# Patient Record
Sex: Female | Born: 1970 | Race: White | Hispanic: No | Smoking: Never smoker
Health system: Southern US, Community
[De-identification: ages and names within clinical notes are randomized; demographics above are authoritative.]

## PROBLEM LIST (undated history)

## (undated) DIAGNOSIS — R636 Underweight: Secondary | ICD-10-CM

---

## 2013-07-17 ENCOUNTER — Emergency Department (HOSPITAL_COMMUNITY): Payer: Medicaid Other

## 2013-07-17 ENCOUNTER — Encounter (HOSPITAL_COMMUNITY): Payer: Self-pay | Admitting: Emergency Medicine

## 2013-07-17 ENCOUNTER — Inpatient Hospital Stay (HOSPITAL_COMMUNITY)
Admission: EM | Admit: 2013-07-17 | Discharge: 2013-07-20 | DRG: 480 | Disposition: A | Discharge status: Home / Self Care | Payer: Medicaid Other | Attending: Internal Medicine | Admitting: Internal Medicine

## 2013-07-17 DIAGNOSIS — S72141A Displaced intertrochanteric fracture of right femur, initial encounter for closed fracture: Secondary | ICD-10-CM | POA: Diagnosis present

## 2013-07-17 DIAGNOSIS — Z681 Body mass index (BMI) 19 or less, adult: Secondary | ICD-10-CM | POA: Diagnosis present

## 2013-07-17 DIAGNOSIS — M84453A Pathological fracture, unspecified femur, initial encounter for fracture: Principal | ICD-10-CM | POA: Diagnosis present

## 2013-07-17 DIAGNOSIS — R9431 Abnormal electrocardiogram [ECG] [EKG]: Secondary | ICD-10-CM | POA: Diagnosis present

## 2013-07-17 DIAGNOSIS — E872 Acidosis, unspecified: Secondary | ICD-10-CM | POA: Diagnosis present

## 2013-07-17 DIAGNOSIS — F5000 Anorexia nervosa, unspecified: Secondary | ICD-10-CM | POA: Diagnosis present

## 2013-07-17 DIAGNOSIS — R636 Underweight: Secondary | ICD-10-CM | POA: Diagnosis present

## 2013-07-17 DIAGNOSIS — M810 Age-related osteoporosis without current pathological fracture: Secondary | ICD-10-CM | POA: Diagnosis present

## 2013-07-17 DIAGNOSIS — E8729 Other acidosis: Secondary | ICD-10-CM

## 2013-07-17 DIAGNOSIS — D62 Acute posthemorrhagic anemia: Secondary | ICD-10-CM | POA: Diagnosis present

## 2013-07-17 DIAGNOSIS — W010XXA Fall on same level from slipping, tripping and stumbling without subsequent striking against object, initial encounter: Secondary | ICD-10-CM

## 2013-07-17 DIAGNOSIS — E41 Nutritional marasmus: Secondary | ICD-10-CM | POA: Diagnosis present

## 2013-07-17 DIAGNOSIS — S72143A Displaced intertrochanteric fracture of unspecified femur, initial encounter for closed fracture: Secondary | ICD-10-CM

## 2013-07-17 HISTORY — DX: Underweight: R63.6

## 2013-07-17 LAB — FOLATE: Folate: 15.7 ng/mL

## 2013-07-17 LAB — KETONES, QUALITATIVE: Acetone, Bld: NEGATIVE

## 2013-07-17 LAB — IRON AND TIBC
Iron: 30 ug/dL — ABNORMAL LOW (ref 42–135)
Saturation Ratios: 11 % — ABNORMAL LOW (ref 20–55)
TIBC: 281 ug/dL (ref 250–470)
UIBC: 251 ug/dL (ref 125–400)

## 2013-07-17 LAB — BASIC METABOLIC PANEL
BUN: 22 mg/dL (ref 6–23)
CHLORIDE: 97 meq/L (ref 96–112)
CO2: 27 mEq/L (ref 19–32)
Calcium: 9.7 mg/dL (ref 8.4–10.5)
Creatinine, Ser: 0.68 mg/dL (ref 0.50–1.10)
GFR calc Af Amer: >90 mL/min (ref >90)
GFR calc non Af Amer: >90 mL/min (ref >90)
GLUCOSE: 116 mg/dL — AB (ref 70–99)
Potassium: 4.2 mEq/L (ref 3.7–5.3)
SODIUM: 141 meq/L (ref 137–147)

## 2013-07-17 LAB — RETICULOCYTES
RBC.: 3.24 MIL/uL — AB (ref 3.87–5.11)
Retic Count, Absolute: 74.5 10*3/uL (ref 19.0–186.0)
Retic Ct Pct: 2.3 % (ref 0.4–3.1)

## 2013-07-17 LAB — TYPE AND SCREEN
ABO/RH(D): B POS
Antibody Screen: NEGATIVE

## 2013-07-17 LAB — PROTIME-INR
INR: 0.93 (ref 0.00–1.49)
PROTHROMBIN TIME: 12.3 s (ref 11.6–15.2)

## 2013-07-17 LAB — HEPATIC FUNCTION PANEL
ALK PHOS: 85 U/L (ref 39–117)
ALT: 23 U/L (ref 0–35)
AST: 22 U/L (ref 0–37)
Albumin: 2.9 g/dL — ABNORMAL LOW (ref 3.5–5.2)
BILIRUBIN TOTAL: 0.4 mg/dL (ref 0.3–1.2)
Bilirubin, Direct: <0.2 mg/dL (ref 0.0–0.3)
Total Protein: 5.9 g/dL — ABNORMAL LOW (ref 6.0–8.3)

## 2013-07-17 LAB — CBC
HCT: 32.9 % — ABNORMAL LOW (ref 36.0–46.0)
Hemoglobin: 11 g/dL — ABNORMAL LOW (ref 12.0–15.0)
MCH: 29.6 pg (ref 26.0–34.0)
MCHC: 33.4 g/dL (ref 30.0–36.0)
MCV: 88.7 fL (ref 78.0–100.0)
PLATELETS: 298 10*3/uL (ref 150–400)
RBC: 3.71 MIL/uL — ABNORMAL LOW (ref 3.87–5.11)
RDW: 12.7 % (ref 11.5–15.5)
WBC: 7.5 10*3/uL (ref 4.0–10.5)

## 2013-07-17 LAB — FERRITIN: FERRITIN: 76 ng/mL (ref 10–291)

## 2013-07-17 LAB — LACTIC ACID, PLASMA: Lactic Acid, Venous: 0.7 mmol/L (ref 0.5–2.2)

## 2013-07-17 LAB — VITAMIN B12: Vitamin B-12: 451 pg/mL (ref 211–911)

## 2013-07-17 LAB — MAGNESIUM: Magnesium: 2 mg/dL (ref 1.5–2.5)

## 2013-07-17 LAB — ABO/RH: ABO/RH(D): B POS

## 2013-07-17 LAB — PHOSPHORUS: Phosphorus: 5.1 mg/dL — ABNORMAL HIGH (ref 2.3–4.6)

## 2013-07-17 MED ORDER — ACETAMINOPHEN 325 MG PO TABS
650.0000 mg | ORAL_TABLET | Freq: Four times a day (QID) | ORAL | Status: DC | PRN
  Administered 2013-07-18 – 2013-07-20 (×6): 650 mg via ORAL
  Filled 2013-07-17 (×7): qty 2

## 2013-07-17 MED ORDER — CEFAZOLIN SODIUM-DEXTROSE 2-3 GM-% IV SOLR
2.0000 g | INTRAVENOUS | Status: AC
  Administered 2013-07-18: 2 g via INTRAVENOUS
  Filled 2013-07-17: qty 50

## 2013-07-17 MED ORDER — THIAMINE HCL 100 MG/ML IJ SOLN
100.0000 mg | Freq: Once | INTRAMUSCULAR | Status: AC
  Administered 2013-07-17: 100 mg via INTRAVENOUS
  Filled 2013-07-17: qty 1

## 2013-07-17 MED ORDER — MORPHINE SULFATE 2 MG/ML IJ SOLN
2.0000 mg | Freq: Once | INTRAMUSCULAR | Status: AC
  Administered 2013-07-17: 2 mg via INTRAVENOUS
  Filled 2013-07-17: qty 1

## 2013-07-17 MED ORDER — MORPHINE SULFATE 2 MG/ML IJ SOLN: 2.0000 mg | INTRAMUSCULAR | Status: DC | PRN

## 2013-07-17 MED ORDER — ACETAMINOPHEN 650 MG RE SUPP: 650.0000 mg | Freq: Four times a day (QID) | RECTAL | Status: DC | PRN

## 2013-07-17 MED ORDER — DEXTROSE-NACL 5-0.9 % IV SOLN
INTRAVENOUS | Status: DC
  Administered 2013-07-17: 50 mL/h via INTRAVENOUS

## 2013-07-17 MED ORDER — DIAZEPAM 5 MG PO TABS
5.0000 mg | ORAL_TABLET | Freq: Once | ORAL | Status: AC
Start: 2013-07-17 — End: 2013-07-17
  Administered 2013-07-17: 5 mg via ORAL
  Filled 2013-07-17: qty 1

## 2013-07-17 MED ORDER — PROCHLORPERAZINE EDISYLATE 5 MG/ML IJ SOLN
5.0000 mg | Freq: Four times a day (QID) | INTRAMUSCULAR | Status: DC | PRN
  Filled 2013-07-17: qty 1

## 2013-07-17 NOTE — ED Provider Notes (Signed)
CSN: [id number]     Arrival date & time 07/17/13  0902 History   First MD Initiated Contact with Patient 07/17/13 [phone]     Chief Complaint  Patient presents with  . Groin Pain     (Consider location/radiation/quality/duration/timing/severity/associated sxs/prior Treatment) HPI Comments: Patient is a 43 year old female the past medical history of anorexia who presents to the emergency department complaining of right groin pain and hip pain x4 days after stepping over a gate for her cats causing herself to slip and "butterfly" her legs out. Since then she has had non-radiating pain to her groin, progressively worsening, worse with ambulation rated 8.5/10. States she is unable to bear weight on her right leg and has been crawling up and down stairs. Denies back or knee pain. Denies numbness or tingling.  Patient is a 43 y.o. female presenting with groin pain. The history is provided by the patient.  Groin Pain    History reviewed. No pertinent past medical history. History reviewed. No pertinent past surgical history. History reviewed. No pertinent family history. History  Substance Use Topics  . Smoking status: Never Smoker   . Smokeless tobacco: Never Used  . Alcohol Use: No   OB History   Grav Para Term Preterm Abortions TAB SAB Ect Mult Living                 Review of Systems  Musculoskeletal:       Positive for right groin and hip pain.  All other systems reviewed and are negative.      Allergies  Penicillins  Home Medications   Current Outpatient Rx  Name  Route  Sig  Dispense  Refill  . ibuprofen (ADVIL,MOTRIN) 200 MG tablet   Oral   Take 200 mg by mouth every 6 (six) hours as needed for fever, headache or mild pain.         Marland Kitchen. OVER THE COUNTER MEDICATION   Topical   Apply 1 application topically 3 (three) times daily as needed (for pain). Natural pain relief          BP 103/60  Pulse 100  Temp(Src) 97.9 F (36.6 C) (Oral)  Resp 16  Ht 5\' 9"  (1.753  m)  Wt 100 lb (45.36 kg)  BMI 14.76 kg/m2  SpO2 96% Physical Exam  Nursing note and vitals reviewed. Constitutional: She is oriented to person, place, and time. She appears well-developed. No distress.  Thin.  HENT:  Head: Normocephalic and atraumatic.  Mouth/Throat: Oropharynx is clear and moist.  Eyes: Conjunctivae are normal.  Neck: Normal range of motion. Neck supple.  Cardiovascular: Regular rhythm, normal heart sounds and intact distal pulses.  Tachycardia present.   Pulses:      Femoral pulses are 2+ on the right side.      Dorsalis pedis pulses are 2+ on the right side.       Posterior tibial pulses are 2+ on the right side.  Pulmonary/Chest: Effort normal and breath sounds normal.  Musculoskeletal: She exhibits no edema.       Legs: TTP right groin area. No bony tenderness. Hip flexion limited due to pain. Pain with internal and external rotation. No pelvic tenderness. Normal lumbar spine and right knee. No deformity or swelling.  Neurological: She is alert and oriented to person, place, and time. No sensory deficit.  Skin: Skin is warm and dry. She is not diaphoretic.  Psychiatric: Her behavior is normal. Her mood appears anxious.    ED Course  Procedures (  including critical care time) Labs Review Labs Reviewed  CBC  BASIC METABOLIC PANEL  PROTIME-INR  TYPE AND SCREEN   Imaging Review Dg Hip Complete Right  07/17/2013   CLINICAL DATA:  Pain post trauma  EXAM: RIGHT HIP - COMPLETE 2+ VIEW  COMPARISON:  None.  FINDINGS: Frontal pelvis as well as frontal and lateral right hip images were obtained. There is an impacted intertrochanteric femur fracture on the right. No other fracture. No dislocation. There is mild symmetric narrowing of both hip joints.  IMPRESSION: Impacted intertrochanteric femur fracture on the right.   Electronically Signed   By: Bretta Bang M.D.   On: 07/17/2013 10:14     EKG Interpretation None      MDM   Final diagnoses:  Fracture,  intertrochanteric, right femur   Patient presenting with right hip and groin pain after fall. She is well appearing and in no apparent distress, very anxious. Neurovascularly intact. No deformity. X-ray showing impacted intertrochanteric femur fracture on the right. History of anorexia, probably osteopenic. I spoke with Dr. Eulah Pont, orthopedist on call who will evaluate patient later today and perform surgery tomorrow afternoon. Patient admitted by internal medicine teaching service, attending Dr. Meredith Pel. Preop labs, EKG and chest x-ray ordered per Dr. Eulah Pont. Case discussed with attending Dr. Fayrene Fearing who agrees with plan of care.     Trevor Mace, PA-C 07/17/13 1108

## 2013-07-17 NOTE — Consult Note (Signed)
     ORTHOPAEDIC CONSULTATION  REQUESTING PHYSICIAN: No att. providers found  Chief Complaint: R hip fracture  HPI: Jasmine Wagner is a 43 y.o. female who complains of  Mechanical fall, R hip pain, h/o anorexia   No past medical history on file. No past surgical history on file. History   Social History  . Marital Status: Widowed    Spouse Name: N/A    Number of Children: N/A  . Years of Education: N/A   Social History Main Topics  . Smoking status: Never Smoker   . Smokeless tobacco: Never Used  . Alcohol Use: No  . Drug Use: Not on file  . Sexual Activity: Not on file   Other Topics Concern  . Not on file   Social History Narrative  . No narrative on file   No family history on file. Allergies  Allergen Reactions  . Penicillins Rash   Prior to Admission medications   Medication Sig Start Date End Date Taking? Authorizing Provider  ibuprofen (ADVIL,MOTRIN) 200 MG tablet Take 200 mg by mouth every 6 (six) hours as needed for fever, headache or mild pain.    Historical Provider, MD  OVER THE COUNTER MEDICATION Apply 1 application topically 3 (three) times daily as needed (for pain). Natural pain relief    Historical Provider, MD   Dg Hip Complete Right  07/17/2013   CLINICAL DATA:  Pain post trauma  EXAM: RIGHT HIP - COMPLETE 2+ VIEW  COMPARISON:  None.  FINDINGS: Frontal pelvis as well as frontal and lateral right hip images were obtained. There is an impacted intertrochanteric femur fracture on the right. No other fracture. No dislocation. There is mild symmetric narrowing of both hip joints.  IMPRESSION: Impacted intertrochanteric femur fracture on the right.   Electronically Signed   By: Lowella Grip M.D.   On: 07/17/2013 10:14    Positive ROS: All other systems have been reviewed and were otherwise negative with the exception of those mentioned in the HPI and as above.  Labs cbc No results found for this basename: WBC, HGB, HCT, PLT,  in the last 72  hours  Labs inflam No results found for this basename: ESR, CRP,  in the last 72 hours  Labs coag No results found for this basename: INR, PT, PTT,  in the last 72 hours  No results found for this basename: NA, K, CL, CO2, GLUCOSE, BUN, CREATININE, CALCIUM,  in the last 72 hours  Physical Exam: There were no vitals filed for this visit. General: Alert, no acute distress Cardiovascular: No pedal edema Respiratory: No cyanosis, no use of accessory musculature GI: No organomegaly, abdomen is soft and non-tender Skin: No lesions in the area of chief complaint Neurologic: Sensation intact distally Psychiatric: Patient is competent for consent with normal mood and affect Lymphatic: No axillary or cervical lymphadenopathy  MUSCULOSKELETAL:  RLE: pain with log roll, skin benign, distally NVI Other extremities are atraumatic with painless ROM and NVI.  Assessment: R intertroch fracture  Plan: IM nail on 3/10 Weight Bearing Status: bed rest for now, will adjust to WBAT post op PT VTE px: SCD's and hold chemical px for OR tomorrow, will likely start ASA 325 post op   Edmonia Lynch, D, MD Cell [phone]   07/17/2013 11:01 AM

## 2013-07-17 NOTE — H&P (Signed)
Date: 07/17/2013               Patient Name:  Jasmine Wagner MRN: [id number]  DOB: Aug 21, 1970 Age / Sex: 43 y.o., female   PCP: Lupe Carney, MD         Medical Service: Internal Medicine Teaching Service         Attending Physician: Dr. Farley Ly, MD    First Contact: Dr. Glendell Docker Pager: [phone]  Second Contact: Dr. Burtis Junes Pager: [phone]       After Hours (After 5p/  First Contact Pager: [phone]  weekends / holidays): Second Contact Pager: [phone]   Chief Complaint: hip fracture  History of Present Illness:  The patient is a 43 yo woman, presenting with a hip fracture.  Four days prior to admission, the patient was climbing over a fence, when she lost her balance and "did a split", with acute pain in her right groin.  The pain has been persistent since that time, making the patient unable to bear weight on her right leg.  She has been taking ibuprofen for the pain.  She notes being able to keep up with PO intake of food and water, and notes no other symptoms.  She notes no prior history of fractures, no family history of osteoporosis (mother with osteopenia in 30's), and no tobacco or alcohol usage, though the patient's BMI is currently 14.8, which the patient notes is around her baseline.  Meds: No current facility-administered medications for this encounter.   Current Outpatient Prescriptions  Medication Sig Dispense Refill  . ibuprofen (ADVIL,MOTRIN) 200 MG tablet Take 200 mg by mouth every 6 (six) hours as needed for fever, headache or mild pain.      Marland Kitchen OVER THE COUNTER MEDICATION Apply 1 application topically 3 (three) times daily as needed (for pain). Natural pain relief        Allergies: Allergies as of 07/17/2013 - Review Complete 07/17/2013  Allergen Reaction Noted  . Penicillins Rash 07/17/2013   Past Medical History  Diagnosis Date  . Underweight    History reviewed. No pertinent past surgical history. Family History  Problem Relation Age of Onset  .  Osteopenia Mother 73  . Hypertension Father   . Hypertension Mother    History   Social History  . Marital Status: Widowed    Spouse Name: N/A    Number of Children: N/A  . Years of Education: N/A   Occupational History  . Not on file.   Social History Main Topics  . Smoking status: Never Smoker   . Smokeless tobacco: Never Used  . Alcohol Use: No  . Drug Use: Not on file  . Sexual Activity: Not on file   Other Topics Concern  . Not on file   Social History Narrative   The patient's husband passed away Feb 17, 2013.  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  She has a sister in town.    Review of Systems: General: no fevers, chills, changes in weight, changes in appetite Skin: no rash HEENT: no blurry vision, hearing changes, sore throat Pulm: no dyspnea, coughing, wheezing CV: no chest pain, palpitations, shortness of breath Abd: no abdominal pain, nausea/vomiting, diarrhea/constipation GU: no dysuria, hematuria, polyuria Ext: see HPI Neuro: no weakness, numbness, or tingling  Physical Exam: Blood pressure 111/90, pulse 100, temperature 97.9 F (36.6 C), temperature source Oral, resp. rate 14, height 5\' 9"  (1.753 m), weight 100 lb (45.36 kg), SpO2 100.00%. General: alert, cooperative,  and in no apparent distress HEENT: pupils equal round and reactive to light, vision grossly intact, oropharynx clear and non-erythematous  Neck: supple Lungs: clear to ascultation bilaterally, normal work of respiration, no wheezes, rales, ronchi Heart: regular rate and rhythm, no murmurs, gallops, or rubs Abdomen: soft, non-tender, non-distended, normal bowel sounds Extremities: no cyanosis, clubbing, or edema. Pain with movement of right hip Neurologic: alert & oriented X3, cranial nerves II-XII intact, strength grossly intact though examination limited by pain, sensation intact  Lab results: Basic Metabolic Panel:  Recent Labs  16/02/9602/10/15 1033  NA 141  K 4.2  CL 97    CO2 27  GLUCOSE 116*  BUN 22  CREATININE 0.68  CALCIUM 9.7   CBC:  Recent Labs  07/17/13 1033  WBC 7.5  HGB 11.0*  HCT 32.9*  MCV 88.7  PLT 298   Coagulation:  Recent Labs  07/17/13 1033  LABPROT 12.3  INR 0.93    Imaging results:  Dg Chest 2 View  07/17/2013   CLINICAL DATA:  Preoperative chest radiograph.  EXAM: CHEST  2 VIEW  COMPARISON:  None.  FINDINGS: Hyperinflation is present compatible with emphysema. Flattening of the hemidiaphragms. No airspace disease. Bilateral pleural apical scarring. Extreme apices are excluded from view. Aortic arch atherosclerosis.  IMPRESSION: Emphysema without acute cardiopulmonary disease.   Electronically Signed   By: Andreas NewportGeoffrey  Lamke M.D.   On: 07/17/2013 11:25   Dg Hip Complete Right  07/17/2013   CLINICAL DATA:  Pain post trauma  EXAM: RIGHT HIP - COMPLETE 2+ VIEW  COMPARISON:  None.  FINDINGS: Frontal pelvis as well as frontal and lateral right hip images were obtained. There is an impacted intertrochanteric femur fracture on the right. No other fracture. No dislocation. There is mild symmetric narrowing of both hip joints.  IMPRESSION: Impacted intertrochanteric femur fracture on the right.   Electronically Signed   By: Bretta BangWilliam  Woodruff M.D.   On: 07/17/2013 10:14    Other results: EKG: NSR, no ST abnormalities, prolonged QT  Assessment & Plan by Problem:  # Acute right femur fracture - given benign mechanism of injury, this represents a fragility fracture, likely representing osteoporosis.  The patient's only risk factor is her low weight, with concern for possible anorexia (risk of osteoporosis 30% in anorexia). -orthopedics consulted, plan for operative repair tomorrow -morphine, compazine prn for symptomatic management  # Elevated AG - likely represents starvation ketoacidosis.  Rule out lactic acidosis given recent fall/fracture. -check lactic acid, ketones -start D5-NS  # Underweight - The patient's BMI of 14.8 is  concerning for malnutrition vs anorexia nervosa. -check Mg, phos, thiamine, LFT's -give thiamine prior to D5, given concern for nutritional deficiency  # Normocytic Anemia - no evidence for acute bleed.  Likely related to nutritional deficiency. -monitor CBC's -check anemia panel  # Prolonged QT - QTc = 526, which could be related to nutritional deficiencies (hypomagnesemia), though is also seen independently in anorexia nervosa -check Mg -avoid QT prolonging meds  # Prophy - SCD's per ortho  Dispo: Disposition is deferred at this time, awaiting improvement of current medical problems. Anticipated discharge in approximately 2-4 day(s).   The patient does have a current PCP Lupe Carney(Dean Mitchell, MD) and does not [facility] after discharge.  Signed: Linward Headlandyan K Chamberlain Steinborn, MD 07/17/2013, 11:40 AM

## 2013-07-17 NOTE — Progress Notes (Signed)
Utilization review completed.  

## 2013-07-17 NOTE — H&P (Signed)
Internal Medicine Attending Admission Note Date: 07/17/2013  Patient name: Jasmine OharaLisa Pasqua Medical record number: [id number] Date of birth: 09/16/1970 Age: 43 y.o. Gender: female  I saw and evaluated the patient. I reviewed the resident's note and I agree with the resident's findings and plan as documented in the resident's note, with the following additional comments.  Chief Complaint(s): Right hip pain following fall  History - key components related to admission: Patient is a 43 year old woman who denies significant past medical problems who presented with right hip pain and inability to bear weight on her right leg following a fall which occurred 4 days prior to admission.  Patient reports that she fell when stepping across a pet gate in her home.  She denies any history of heart problems or lung problems.  She has no history of smoking.  She states that her current weight is around her baseline   Physical Exam - key components related to admission:  Filed Vitals:   07/17/13 0945 07/17/13 1030 07/17/13 1130 07/17/13 1241  BP: 112/63 103/60 111/90 99/56  Pulse: 89 100    Temp:      TempSrc:      Resp:   14 22  Height:      Weight:      SpO2: 100% 96% 100% 99%    General: Alert, thin, no acute distress Lungs: Clear Heart: Regular; no extra sounds or murmurs Abdomen: Bowel sounds present, soft, nontender; no hepatosplenomegaly Extremities: No edema  Lab results:   Basic Metabolic Panel:  Recent Labs  40/98/1102/02/21 1033  NA 141  K 4.2  CL 97  CO2 27  GLUCOSE 116*  BUN 22  CREATININE 0.68  CALCIUM 9.7    CBC:  Recent Labs  07/17/13 1033  WBC 7.5  HGB 11.0*  HCT 32.9*  MCV 88.7  PLT 298    Coagulation:  Recent Labs  07/17/13 1033  INR 0.93    Imaging results:  Dg Chest 2 View  07/17/2013   CLINICAL DATA:  Preoperative chest radiograph.  EXAM: CHEST  2 VIEW  COMPARISON:  None.  FINDINGS: Hyperinflation is present compatible with emphysema. Flattening of the  hemidiaphragms. No airspace disease. Bilateral pleural apical scarring. Extreme apices are excluded from view. Aortic arch atherosclerosis.  IMPRESSION: Emphysema without acute cardiopulmonary disease.   Electronically Signed   By: Andreas NewportGeoffrey  Lamke M.D.   On: 07/17/2013 11:25   Dg Hip Complete Right  07/17/2013   CLINICAL DATA:  Pain post trauma  EXAM: RIGHT HIP - COMPLETE 2+ VIEW  COMPARISON:  None.  FINDINGS: Frontal pelvis as well as frontal and lateral right hip images were obtained. There is an impacted intertrochanteric femur fracture on the right. No other fracture. No dislocation. There is mild symmetric narrowing of both hip joints.  IMPRESSION: Impacted intertrochanteric femur fracture on the right.   Electronically Signed   By: Bretta BangWilliam  Woodruff M.D.   On: 07/17/2013 10:14    Other results: EKG: Sinus rhythm; PVC; prolonged QTc  Assessment & Plan by Problem:  1.  Right intertrochanteric femur fracture.  Patient was seen by orthopedic surgeon Dr. Eulah PontMurphy, who plans surgery tomorrow.  Plans include pain control; management as per Dr. Eulah PontMurphy.  2.  Low body weight.  Patient is substantially underweight, and this needs further evaluation and a nutrition consult.  She reports that her weight is stable, but acknowledges that she needs to gain weight.  She does not have symptoms that suggest a particular cause.  Plan is  to get records from her PCP; check TSH and discuss HIV testing with her; nutrition consult; consider further diagnostic evaluation after her hip is repaired.  3. Elevated anion gap.  Patients bicarb does not suggest a metabolic acidosis, and the elevated anion gap may be due to metabolic alkalosis secondary to volume contraction.  Plan is to recheck after IV volume replacement.  4.  Other problems and plans as per the resident physician's note.

## 2013-07-17 NOTE — ED Notes (Signed)
Patient complaint of right hip and right inguinal pain since Thursday.  Patient states she was climbing over a gate for the cats and when she put her right leg over the gate, it moved and she "butterflied"  Her legs out and injured the right hip and inguinal area.  Patient states pain has just gotten progressively worse and she has been unable to weight bear on the right leg.

## 2013-07-18 ENCOUNTER — Encounter (HOSPITAL_COMMUNITY): Payer: Medicaid Other | Admitting: Anesthesiology

## 2013-07-18 ENCOUNTER — Encounter (HOSPITAL_COMMUNITY)
Admission: EM | Disposition: A | Discharge status: Home / Self Care | Payer: Self-pay | Source: Home / Self Care | Attending: Internal Medicine

## 2013-07-18 ENCOUNTER — Inpatient Hospital Stay: Admit: 2013-07-18 | Payer: Self-pay | Admitting: Internal Medicine

## 2013-07-18 ENCOUNTER — Inpatient Hospital Stay (HOSPITAL_COMMUNITY): Payer: Medicaid Other

## 2013-07-18 ENCOUNTER — Inpatient Hospital Stay (HOSPITAL_COMMUNITY): Payer: Medicaid Other | Admitting: Anesthesiology

## 2013-07-18 HISTORY — PX: FEMUR IM NAIL: SHX1597

## 2013-07-18 LAB — BASIC METABOLIC PANEL
BUN: 18 mg/dL (ref 6–23)
CHLORIDE: 103 meq/L (ref 96–112)
CO2: 29 mEq/L (ref 19–32)
Calcium: 8.7 mg/dL (ref 8.4–10.5)
Creatinine, Ser: 0.64 mg/dL (ref 0.50–1.10)
Glucose, Bld: 98 mg/dL (ref 70–99)
POTASSIUM: 4.1 meq/L (ref 3.7–5.3)
Sodium: 141 mEq/L (ref 137–147)

## 2013-07-18 LAB — ALBUMIN: Albumin: 2.9 g/dL — ABNORMAL LOW (ref 3.5–5.2)

## 2013-07-18 LAB — VITAMIN D 25 HYDROXY (VIT D DEFICIENCY, FRACTURES): VIT D 25 HYDROXY: 28 ng/mL — AB (ref 30–89)

## 2013-07-18 LAB — MRSA PCR SCREENING: MRSA BY PCR: NEGATIVE

## 2013-07-18 SURGERY — INSERTION, INTRAMEDULLARY ROD, FEMUR
Anesthesia: General | Site: Hip | Laterality: Right

## 2013-07-18 MED ORDER — GLYCOPYRROLATE 0.2 MG/ML IJ SOLN
INTRAMUSCULAR | Status: DC | PRN
  Administered 2013-07-18: .7 mg via INTRAVENOUS

## 2013-07-18 MED ORDER — HYDROMORPHONE HCL PF 1 MG/ML IJ SOLN
INTRAMUSCULAR | Status: AC
Start: 2013-07-18 — End: 2013-07-19
  Filled 2013-07-18: qty 1

## 2013-07-18 MED ORDER — MIDAZOLAM HCL 2 MG/2ML IJ SOLN
INTRAMUSCULAR | Status: AC
  Filled 2013-07-18: qty 2

## 2013-07-18 MED ORDER — LACTATED RINGERS IV SOLN
INTRAVENOUS | Status: DC
  Administered 2013-07-18: 12:00:00 via INTRAVENOUS

## 2013-07-18 MED ORDER — ONDANSETRON HCL 4 MG/2ML IJ SOLN
INTRAMUSCULAR | Status: DC | PRN
  Administered 2013-07-18: 4 mg via INTRAVENOUS

## 2013-07-18 MED ORDER — CHLORHEXIDINE GLUCONATE 4 % EX LIQD
60.0000 mL | Freq: Once | CUTANEOUS | Status: DC
  Filled 2013-07-18: qty 60

## 2013-07-18 MED ORDER — PHENOL 1.4 % MT LIQD: 1.0000 | OROMUCOSAL | Status: DC | PRN

## 2013-07-18 MED ORDER — ROCURONIUM BROMIDE 100 MG/10ML IV SOLN
INTRAVENOUS | Status: DC | PRN
  Administered 2013-07-18: 20 mg via INTRAVENOUS
  Administered 2013-07-18: 10 mg via INTRAVENOUS

## 2013-07-18 MED ORDER — PROPOFOL 10 MG/ML IV BOLUS
INTRAVENOUS | Status: AC
  Filled 2013-07-18: qty 20

## 2013-07-18 MED ORDER — 0.9 % SODIUM CHLORIDE (POUR BTL) OPTIME
TOPICAL | Status: DC | PRN
  Administered 2013-07-18: 1000 mL

## 2013-07-18 MED ORDER — ACETAMINOPHEN 500 MG PO TABS
1000.0000 mg | ORAL_TABLET | Freq: Once | ORAL | Status: AC
  Administered 2013-07-18: 1000 mg via ORAL
  Filled 2013-07-18: qty 2

## 2013-07-18 MED ORDER — DOCUSATE SODIUM 100 MG PO CAPS
100.0000 mg | ORAL_CAPSULE | Freq: Two times a day (BID) | ORAL | Status: DC
  Administered 2013-07-19: 100 mg via ORAL
  Filled 2013-07-18 (×2): qty 1

## 2013-07-18 MED ORDER — FENTANYL CITRATE 0.05 MG/ML IJ SOLN
INTRAMUSCULAR | Status: AC
  Filled 2013-07-18: qty 5

## 2013-07-18 MED ORDER — LIDOCAINE HCL (CARDIAC) 20 MG/ML IV SOLN
INTRAVENOUS | Status: DC | PRN
  Administered 2013-07-18: 50 mg via INTRAVENOUS

## 2013-07-18 MED ORDER — FENTANYL CITRATE 0.05 MG/ML IJ SOLN
INTRAMUSCULAR | Status: DC | PRN
  Administered 2013-07-18: 100 ug via INTRAVENOUS

## 2013-07-18 MED ORDER — HYDROMORPHONE HCL PF 1 MG/ML IJ SOLN
0.2500 mg | INTRAMUSCULAR | Status: DC | PRN
  Administered 2013-07-18 (×2): 0.5 mg via INTRAVENOUS

## 2013-07-18 MED ORDER — PROMETHAZINE HCL 25 MG/ML IJ SOLN: 6.2500 mg | INTRAMUSCULAR | Status: DC | PRN

## 2013-07-18 MED ORDER — OXYCODONE HCL 5 MG PO TABS: 5.0000 mg | ORAL_TABLET | ORAL | Status: DC | PRN

## 2013-07-18 MED ORDER — CEFAZOLIN SODIUM-DEXTROSE 2-3 GM-% IV SOLR
2.0000 g | Freq: Four times a day (QID) | INTRAVENOUS | Status: AC
  Administered 2013-07-18 (×2): 2 g via INTRAVENOUS
  Filled 2013-07-18 (×2): qty 50

## 2013-07-18 MED ORDER — CEFAZOLIN SODIUM 1-5 GM-% IV SOLN
INTRAVENOUS | Status: DC | PRN
  Administered 2013-07-18: 1 g via INTRAVENOUS

## 2013-07-18 MED ORDER — MENTHOL 3 MG MT LOZG: 1.0000 | LOZENGE | OROMUCOSAL | Status: DC | PRN

## 2013-07-18 MED ORDER — OXYCODONE HCL 5 MG PO TABS: 10.0000 mg | ORAL_TABLET | ORAL | Status: DC | PRN

## 2013-07-18 MED ORDER — OXYCODONE HCL 5 MG PO TABS: 5.0000 mg | ORAL_TABLET | Freq: Once | ORAL | Status: DC | PRN

## 2013-07-18 MED ORDER — OXYCODONE HCL 5 MG/5ML PO SOLN: 5.0000 mg | Freq: Once | ORAL | Status: DC | PRN

## 2013-07-18 MED ORDER — NEOSTIGMINE METHYLSULFATE 1 MG/ML IJ SOLN
INTRAMUSCULAR | Status: DC | PRN
  Administered 2013-07-18: 4 mg via INTRAVENOUS

## 2013-07-18 MED ORDER — DEXTROSE-NACL 5-0.45 % IV SOLN
100.0000 mL/h | INTRAVENOUS | Status: DC
Start: 2013-07-18 — End: 2013-07-18

## 2013-07-18 MED ORDER — LACTATED RINGERS IV SOLN
INTRAVENOUS | Status: DC | PRN
  Administered 2013-07-18 (×2): via INTRAVENOUS

## 2013-07-18 MED ORDER — MIDAZOLAM HCL 5 MG/5ML IJ SOLN
INTRAMUSCULAR | Status: DC | PRN
  Administered 2013-07-18: 2 mg via INTRAVENOUS

## 2013-07-18 MED ORDER — DOCUSATE SODIUM 100 MG PO CAPS: 100.0000 mg | ORAL_CAPSULE | Freq: Two times a day (BID) | ORAL | Status: AC

## 2013-07-18 MED ORDER — ASPIRIN EC 325 MG PO TBEC
325.0000 mg | DELAYED_RELEASE_TABLET | Freq: Every day | ORAL | Status: DC
  Administered 2013-07-19 – 2013-07-20 (×2): 325 mg via ORAL
  Filled 2013-07-18 (×3): qty 1

## 2013-07-18 MED ORDER — ALBUMIN HUMAN 5 % IV SOLN
INTRAVENOUS | Status: DC | PRN
  Administered 2013-07-18: 13:00:00 via INTRAVENOUS

## 2013-07-18 MED ORDER — ONDANSETRON HCL 4 MG PO TABS: 4.0000 mg | ORAL_TABLET | Freq: Three times a day (TID) | ORAL | Status: DC | PRN

## 2013-07-18 MED ORDER — ASPIRIN EC 325 MG PO TBEC: 325.0000 mg | DELAYED_RELEASE_TABLET | Freq: Every day | ORAL | Status: AC

## 2013-07-18 MED ORDER — PROPOFOL 10 MG/ML IV BOLUS
INTRAVENOUS | Status: DC | PRN
Start: 2013-07-18 — End: 2013-07-18
  Administered 2013-07-18: 110 mg via INTRAVENOUS

## 2013-07-18 SURGICAL SUPPLY — 35 items
BIT DRILL AO GAMMA 4.2X180 (BIT) ×3 IMPLANT
COVER SURGICAL LIGHT HANDLE (MISCELLANEOUS) ×3 IMPLANT
DRAPE STERI IOBAN 125X83 (DRAPES) ×3 IMPLANT
DRSG EMULSION OIL 3X3 NADH (GAUZE/BANDAGES/DRESSINGS) ×3 IMPLANT
DRSG TEGADERM 4X4.75 (GAUZE/BANDAGES/DRESSINGS) ×3 IMPLANT
DURAPREP 26ML APPLICATOR (WOUND CARE) ×3 IMPLANT
ELECT REM PT RETURN 9FT ADLT (ELECTROSURGICAL) ×3
ELECTRODE REM PT RTRN 9FT ADLT (ELECTROSURGICAL) ×1 IMPLANT
GLOVE BIO SURGEON STRL SZ7.5 (GLOVE) ×3 IMPLANT
GLOVE BIOGEL PI IND STRL 8 (GLOVE) ×1 IMPLANT
GLOVE BIOGEL PI INDICATOR 8 (GLOVE) ×2
GOWN STRL REUS W/ TWL LRG LVL3 (GOWN DISPOSABLE) ×1 IMPLANT
GOWN STRL REUS W/TWL LRG LVL3 (GOWN DISPOSABLE) ×2
GUIDEROD T2 3X1000 (ROD) ×3 IMPLANT
K-WIRE  3.2X450M STR (WIRE) ×2
K-WIRE 3.2X450M STR (WIRE) ×1
KIT BASIN OR (CUSTOM PROCEDURE TRAY) ×3 IMPLANT
KIT NAIL LONG 10X380MMX125 (Nail) ×3 IMPLANT
KIT ROOM TURNOVER OR (KITS) ×3 IMPLANT
KWIRE 3.2X450M STR (WIRE) ×1 IMPLANT
MANIFOLD NEPTUNE II (INSTRUMENTS) ×3 IMPLANT
NS IRRIG 1000ML POUR BTL (IV SOLUTION) ×3 IMPLANT
PACK GENERAL/GYN (CUSTOM PROCEDURE TRAY) ×3 IMPLANT
PAD ARMBOARD 7.5X6 YLW CONV (MISCELLANEOUS) ×6 IMPLANT
SCREW LAG GAMMA 3 TI 10.5X90MM (Screw) ×3 IMPLANT
SPONGE GAUZE 4X4 12PLY (GAUZE/BANDAGES/DRESSINGS) ×3 IMPLANT
STAPLER VISISTAT 35W (STAPLE) ×3 IMPLANT
SUT MNCRL AB 4-0 PS2 18 (SUTURE) ×3 IMPLANT
SUT MON AB 2-0 CT1 36 (SUTURE) ×6 IMPLANT
SUT VIC AB 0 CT1 27 (SUTURE) ×2
SUT VIC AB 0 CT1 27XBRD ANBCTR (SUTURE) ×1 IMPLANT
TOWEL OR 17X24 6PK STRL BLUE (TOWEL DISPOSABLE) ×3 IMPLANT
TOWEL OR 17X26 10 PK STRL BLUE (TOWEL DISPOSABLE) ×3 IMPLANT
TOWEL OR NON WOVEN STRL DISP B (DISPOSABLE) ×3 IMPLANT
WATER STERILE IRR 1000ML POUR (IV SOLUTION) ×3 IMPLANT

## 2013-07-18 NOTE — Anesthesia Procedure Notes (Signed)
Procedure Name: Intubation Date/Time: 07/18/2013 12:40 PM Performed by: Gwenyth AllegraADAMI, Jasmine Wagner Pre-anesthesia Checklist: Emergency Drugs available, Suction available, Patient being monitored, Patient identified and Timeout performed Patient Re-evaluated:Patient Re-evaluated prior to inductionOxygen Delivery Method: Circle system utilized Preoxygenation: Pre-oxygenation with 100% oxygen Intubation Type: IV induction Ventilation: Mask ventilation without difficulty Laryngoscope Size: Mac and 3 Grade View: Grade I Tube type: Oral Tube size: 7.0 mm Number of attempts: 1 Airway Equipment and Method: Stylet Secured at: 21 cm Tube secured with: Tape Dental Injury: Teeth and Oropharynx as per pre-operative assessment

## 2013-07-18 NOTE — Interval H&P Note (Signed)
History and Physical Interval Note:  07/18/2013 12:24 PM  Rita OharaLisa Lemay  has presented today for surgery, with the diagnosis of HIP FRACTURE  The various methods of treatment have been discussed with the patient and family. After consideration of risks, benefits and other options for treatment, the patient has consented to  Procedure(s):  RIGHT HIP GAMMA NAIL  INTRAMEDULLARY (IM) NAIL HIP FRACTURE (Right) as a surgical intervention .  The patient's history has been reviewed, patient examined, no change in status, stable for surgery.  I have reviewed the patient's chart and labs.  Questions were answered to the patient's satisfaction.     Kessler Solly, D

## 2013-07-18 NOTE — Progress Notes (Signed)
Orthopedic Tech Progress Note Patient Details:  Jasmine Wagner 01/04/1971 [id number]  Ortho Devices Ortho Device/Splint Location: put ohf on bed Ortho Device/Splint Interventions: Ordered;Application;Adjustment   Jennye MoccasinHughes, Len Azeez Craig 07/18/2013, 3:34 PM

## 2013-07-18 NOTE — Progress Notes (Signed)
Internal Medicine Attending  Date: 07/18/2013  Patient name: Jasmine OharaLisa Hanger Medical record number: [id number] Date of birth: 08/15/1970 Age: 43 y.o. Gender: female  I saw and evaluated the patient on A.M rounds, and discussed her care with housestaff.  I reviewed the resident's note by Dr. Glendell DockerKomanski and I agree with the resident's findings and plans as documented in his note.

## 2013-07-18 NOTE — Progress Notes (Addendum)
INITIAL NUTRITION ASSESSMENT  DOCUMENTATION CODES Per approved criteria  -Severe malnutrition in the context of chronic illness   INTERVENTION: 1.  General healthful diet; encourage intake of foods and beverages as able.  RD to follow and assess for nutritional adequacy.   NUTRITION DIAGNOSIS: Inadequate oral intake related to disordered eating patterns as evidenced by pt report.   Monitor:  1.  Food/Beverage; pt meeting >/=90% estimated needs with tolerance. 2.  Wt/wt change; monitor trends, promote wt gain  Reason for Assessment: consult  43 y.o. female  Admitting Dx: Fracture, intertrochanteric, right femur  ASSESSMENT: Pt admitted with hip fracture s/p fall.  RD consulted for assessment. RD met with pt who reports a history of wt control and poor PO.  Pt states that she has been controlling her weight with diet (eats 1 meal per day, grilled chicken or salad) and exercising (walking 2 hours every day, cleans house every day).  Pt recounts stressors in her childhood such as being teased for weight and a very stressful marriage with her drug-abusing husband who took his own life in October 2014.  Pt states she has 5 children at home.  Pt is spending a great deal of time managing/thinking about her weight and is also worrying about the weight/nutrition of each of her 5 children.  RD attempted to impress upon patient the importance of nutrition.  Discussed how her current thoughts and approaches to nutrition are causing her anxiety and taking up a lot of her time. Discussed with pt the importance of having support and help in order to heal and progress her rehabilitation.  Stated the importance of having someone to help her process the amount of stress, loss, and anxiety she has experienced over the past few years in order for her to progress her nutrition.   Pt states that her OBGYN has mentioned to her disorder eating patterns might be consistent with anorexia or bulemia, but she has  never been diagnosed with an eating disorder and has never sought or received professional help.  RD HIGHLY suspects patient has a SEVERE EATING DISORDER. She would greatly benefit from review with an MD and from referral [place] for counseling with a therapist who works with disordered eating patients prior to discharge if MD agrees with RD assessment.   Nutrition Focused Physical Exam: Subcutaneous Fat:  Orbital Region: severe wasting Upper Arm Region: severe wasting Thoracic and Lumbar Region: severe wasting  Muscle:  Temple Region: severe wasting Clavicle Bone Region: severe wasting Clavicle and Acromion Bone Region: severe wasting Scapular Bone Region: not assessed Dorsal Hand: moderate wasting Patellar Region: not assessed Anterior Thigh Region: not assessed Posterior Calf Region: not assessed  Edema: none present  Pt meets criteria for severe MALNUTRITION in the context of chronic illness as evidenced by severe subcutaneous fat and muscle wasting.   Height: Ht Readings from Last 1 Encounters:  07/17/13 $RemoveB'5\' 9"'nqCSFWUM$  (1.753 m)    Weight: Wt Readings from Last 1 Encounters:  07/17/13 100 lb (45.36 kg)    Ideal Body Weight: 145 lbs  % Ideal Body Weight: 68%  Wt Readings from Last 10 Encounters:  07/17/13 100 lb (45.36 kg)  07/17/13 100 lb (45.36 kg)    Usual Body Weight: 120 lbs per pt, last weighed 3-5 years ago  % Usual Body Weight: 83%  BMI:  Body mass index is 14.76 kg/(m^2).  Estimated Nutritional Needs: Kcal: 2000 kcal per day minimum.  Pt may need more to promote wt gain Protein:  90g Fluid: >1.5 L/day  Skin: intact  Diet Order: General  EDUCATION NEEDS: -Education needs addressed   Intake/Output Summary (Last 24 hours) at 07/18/13 1728 Last data filed at 07/18/13 1430  Gross per 24 hour  Intake   2015 ml  Output      0 ml  Net   2015 ml    Last BM: 3/11   Labs:   Recent Labs Lab 07/17/13 1033 07/17/13 1405  07/18/13 0556  NA 141  --  141  K 4.2  --  4.1  CL 97  --  103  CO2 27  --  29  BUN 22  --  18  CREATININE 0.68  --  0.64  CALCIUM 9.7  --  8.7  MG  --  2.0  --   PHOS  --  5.1*  --   GLUCOSE 116*  --  98    CBG (last 3)  No results found for this basename: GLUCAP,  in the last 72 hours  Scheduled Meds: . [START ON 07/19/2013] aspirin EC  325 mg Oral Q breakfast  .  ceFAZolin (ANCEF) IV  2 g Intravenous Q6H  . docusate sodium  100 mg Oral BID  . HYDROmorphone        Continuous Infusions: . dextrose 5 % and 0.9% NaCl 50 mL/hr (07/17/13 1515)  . lactated ringers 20 mL/hr at 07/18/13 1150    Past Medical History  Diagnosis Date  . Underweight     History reviewed. No pertinent past surgical history.  Brynda Greathouse, MS RD LDN Clinical Inpatient Dietitian Pager: [phone] Weekend/After hours pager: [phone]

## 2013-07-18 NOTE — Anesthesia Postprocedure Evaluation (Signed)
  Anesthesia Post-op Note  Patient: Jasmine OharaLisa Wagner  Procedure(s) Performed: Procedure(s):  RIGHT HIP GAMMA NAIL  INTRAMEDULLARY (IM) NAIL HIP FRACTURE (Right)  Patient Location: PACU  Anesthesia Type:General  Level of Consciousness: awake, alert  and oriented  Airway and Oxygen Therapy: Patient Spontanous Breathing  Post-op Pain: mild  Post-op Assessment: Post-op Vital signs reviewed  Post-op Vital Signs: Reviewed  Complications: No apparent anesthesia complications

## 2013-07-18 NOTE — Anesthesia Preprocedure Evaluation (Signed)
Anesthesia Evaluation    Reviewed: Allergy & Precautions, H&P , NPO status , Patient's Chart, lab work & pertinent test results  Airway       Dental   Pulmonary neg pulmonary ROS,          Cardiovascular negative cardio ROS      Neuro/Psych negative neurological ROS  negative psych ROS   GI/Hepatic Neg liver ROS, malnutrition   Endo/Other  negative endocrine ROS  Renal/GU negative Renal ROS  negative genitourinary   Musculoskeletal   Abdominal   Peds negative pediatric ROS (+)  Hematology negative hematology ROS (+)   Anesthesia Other Findings   Reproductive/Obstetrics negative OB ROS                           Anesthesia Physical Anesthesia Plan  ASA: II  Anesthesia Plan: General   Post-op Pain Management:    Induction: Intravenous  Airway Management Planned: LMA  Additional Equipment:   Intra-op Plan:   Post-operative Plan: Extubation in OR  Informed Consent:   Plan Discussed with: CRNA, Anesthesiologist and Surgeon  Anesthesia Plan Comments:         Anesthesia Quick Evaluation

## 2013-07-18 NOTE — H&P (View-Only) (Signed)
     ORTHOPAEDIC CONSULTATION  REQUESTING PHYSICIAN: No att. providers found  Chief Complaint: R hip fracture  HPI: Jasmine Wagner is a 43 y.o. female who complains of  Mechanical fall, R hip pain, h/o anorexia   No past medical history on file. No past surgical history on file. History   Social History  . Marital Status: Widowed    Spouse Name: N/A    Number of Children: N/A  . Years of Education: N/A   Social History Main Topics  . Smoking status: Never Smoker   . Smokeless tobacco: Never Used  . Alcohol Use: No  . Drug Use: Not on file  . Sexual Activity: Not on file   Other Topics Concern  . Not on file   Social History Narrative  . No narrative on file   No family history on file. Allergies  Allergen Reactions  . Penicillins Rash   Prior to Admission medications   Medication Sig Start Date End Date Taking? Authorizing Provider  ibuprofen (ADVIL,MOTRIN) 200 MG tablet Take 200 mg by mouth every 6 (six) hours as needed for fever, headache or mild pain.    Historical Provider, MD  OVER THE COUNTER MEDICATION Apply 1 application topically 3 (three) times daily as needed (for pain). Natural pain relief    Historical Provider, MD   Dg Hip Complete Right  07/17/2013   CLINICAL DATA:  Pain post trauma  EXAM: RIGHT HIP - COMPLETE 2+ VIEW  COMPARISON:  None.  FINDINGS: Frontal pelvis as well as frontal and lateral right hip images were obtained. There is an impacted intertrochanteric femur fracture on the right. No other fracture. No dislocation. There is mild symmetric narrowing of both hip joints.  IMPRESSION: Impacted intertrochanteric femur fracture on the right.   Electronically Signed   By: Lowella Grip M.D.   On: 07/17/2013 10:14    Positive ROS: All other systems have been reviewed and were otherwise negative with the exception of those mentioned in the HPI and as above.  Labs cbc No results found for this basename: WBC, HGB, HCT, PLT,  in the last 72  hours  Labs inflam No results found for this basename: ESR, CRP,  in the last 72 hours  Labs coag No results found for this basename: INR, PT, PTT,  in the last 72 hours  No results found for this basename: NA, K, CL, CO2, GLUCOSE, BUN, CREATININE, CALCIUM,  in the last 72 hours  Physical Exam: There were no vitals filed for this visit. General: Alert, no acute distress Cardiovascular: No pedal edema Respiratory: No cyanosis, no use of accessory musculature GI: No organomegaly, abdomen is soft and non-tender Skin: No lesions in the area of chief complaint Neurologic: Sensation intact distally Psychiatric: Patient is competent for consent with normal mood and affect Lymphatic: No axillary or cervical lymphadenopathy  MUSCULOSKELETAL:  RLE: pain with log roll, skin benign, distally NVI Other extremities are atraumatic with painless ROM and NVI.  Assessment: R intertroch fracture  Plan: IM nail on 3/10 Weight Bearing Status: bed rest for now, will adjust to WBAT post op PT VTE px: SCD's and hold chemical px for OR tomorrow, will likely start ASA 325 post op   Edmonia Lynch, D, MD Cell [phone]   07/17/2013 11:01 AM

## 2013-07-18 NOTE — Transfer of Care (Signed)
Immediate Anesthesia Transfer of Care Note  Patient: Jasmine OharaLisa Wagner  Procedure(s) Performed: Procedure(s):  RIGHT HIP GAMMA NAIL  INTRAMEDULLARY (IM) NAIL HIP FRACTURE (Right)  Patient Location: PACU  Anesthesia Type:General  Level of Consciousness: awake and alert   Airway & Oxygen Therapy: Patient Spontanous Breathing and Patient connected to nasal cannula oxygen  Post-op Assessment: Report given to PACU RN and Post -op Vital signs reviewed and stable  Post vital signs: Reviewed and stable  Complications: No apparent anesthesia complications

## 2013-07-18 NOTE — Op Note (Signed)
DATE OF SURGERY:  07/18/2013  TIME: 1:28 PM  PATIENT NAME:  Jasmine Wagner  AGE: 43 y.o.  PRE-OPERATIVE DIAGNOSIS:  HIP FRACTURE  POST-OPERATIVE DIAGNOSIS:  SAME  PROCEDURE:   RIGHT HIP GAMMA NAIL  INTRAMEDULLARY (IM) NAIL HIP FRACTURE  SURGEON:  Izrael Peak, D  ASSISTANT:  Janace LittenBrandon Parry OPA  OPERATIVE IMPLANTS: Stryker Gamma Nail with distal interlock screw  PREOPERATIVE INDICATIONS:  Jasmine Wagner is a 43 y.o. year old who fell and suffered a hip fracture. She was brought into the ER and then admitted and optimized and then elected for surgical intervention.    The risks benefits and alternatives were discussed with the patient including but not limited to the risks of nonoperative treatment, versus surgical intervention including infection, bleeding, nerve injury, malunion, nonunion, hardware prominence, hardware failure, need for hardware removal, blood clots, cardiopulmonary complications, morbidity, mortality, among others, and they were willing to proceed.    OPERATIVE PROCEDURE:  The patient was brought to the operating room and placed in the supine position. General anesthesia was administered, with a foley. She was placed on the fracture table.  Closed reduction was performed under C-arm guidance. The length of the femur was also measured using fluoroscopy. Time out was then performed after sterile prep and drape. She received preoperative antibiotics.  Incision was made proximal to the greater trochanter. A guidewire was placed in the appropriate position. Confirmation was made on AP and lateral views. The above-named nail was opened. I opened the proximal femur with a reamer. I then placed the nail by hand easily down. I did not need to ream the femur.  Once the nail was completely seated, I placed a guidepin into the femoral head into the center center position. I measured the length, and then reamed the lateral cortex and up into the head. I then placed the lag screw.  Slight compression was applied. Anatomic fixation achieved. Bone quality was mediocre.  I then secured the proximal interlocking bolt, and took off a half a turn, and then removed the instruments, and took final C-arm pictures AP and lateral the entire length of the leg.    Anatomic reconstruction was achieved, and the wounds were irrigated copiously and closed with Vicryl followed by staples and sterile gauze for the skin. The patient was awakened and returned to PACU in stable and satisfactory condition. There no complications and the patient tolerated the procedure well.  She will be weightbearing as tolerated, and will be on ASA 81mg   for a period of four weeks after discharge.   Margarita Ranaimothy Aroush Chasse, M.D.

## 2013-07-18 NOTE — Progress Notes (Signed)
Subjective:  Patient is anxious about her procedure. I reassured her.   Of note, patient reports a multiple year history of having an eating d/o. See details in a and p.  Objective: Vital signs in last 24 hours: Filed Vitals:   07/17/13 1130 07/17/13 1241 07/17/13 2300 07/18/13 0628  BP: 111/90 99/56 106/58 97/49  Pulse:   85 90  Temp:   98.7 F (37.1 C) 98.2 F (36.8 C)  TempSrc:   Oral Oral  Resp: 14 22 20 18   Height:      Weight:      SpO2: 100% 99% 100% 100%   Weight change:   Intake/Output Summary (Last 24 hours) at 07/18/13 0744 Last data filed at 07/17/13 2300  Gross per 24 hour  Intake    240 ml  Output      0 ml  Net    240 ml   Physical Exam  Constitutional: She is oriented to person, place, and time. No distress.  cachectic  Neurological: She is alert and oriented to person, place, and time.  Skin: She is not diaphoretic.  Psychiatric: She has a normal mood and affect. Her behavior is normal.   Lab Results: Basic Metabolic Panel:  Recent Labs Lab 07/17/13 1033 07/17/13 1405 07/18/13 0556  NA 141  --  141  K 4.2  --  4.1  CL 97  --  103  CO2 27  --  29  GLUCOSE 116*  --  98  BUN 22  --  18  CREATININE 0.68  --  0.64  CALCIUM 9.7  --  8.7  MG  --  2.0  --   PHOS  --  5.1*  --    Liver Function Tests:  Recent Labs Lab 07/17/13 1405  AST 22  ALT 23  ALKPHOS 85  BILITOT 0.4  PROT 5.9*  ALBUMIN 2.9*   CBC:  Recent Labs Lab 07/17/13 1033  WBC 7.5  HGB 11.0*  HCT 32.9*  MCV 88.7  PLT 298   Coagulation:  Recent Labs Lab 07/17/13 1033  LABPROT 12.3  INR 0.93   Anemia Panel:  Recent Labs Lab 07/17/13 1405  VITAMINB12 451  FOLATE 15.7  FERRITIN 76  TIBC 281  IRON 30*  RETICCTPCT 2.3    Studies/Results: Dg Chest 2 View  07/17/2013   CLINICAL DATA:  Preoperative chest radiograph.  EXAM: CHEST  2 VIEW  COMPARISON:  None.  FINDINGS: Hyperinflation is present compatible with emphysema. Flattening of the  hemidiaphragms. No airspace disease. Bilateral pleural apical scarring. Extreme apices are excluded from view. Aortic arch atherosclerosis.  IMPRESSION: Emphysema without acute cardiopulmonary disease.   Electronically Signed   By: Andreas NewportGeoffrey  Lamke M.D.   On: 07/17/2013 11:25   Dg Hip Complete Right  07/17/2013   CLINICAL DATA:  Pain post trauma  EXAM: RIGHT HIP - COMPLETE 2+ VIEW  COMPARISON:  None.  FINDINGS: Frontal pelvis as well as frontal and lateral right hip images were obtained. There is an impacted intertrochanteric femur fracture on the right. No other fracture. No dislocation. There is mild symmetric narrowing of both hip joints.  IMPRESSION: Impacted intertrochanteric femur fracture on the right.   Electronically Signed   By: Bretta BangWilliam  Woodruff M.D.   On: 07/17/2013 10:14   Medications: I have reviewed the patient's current medications. Scheduled Meds: . acetaminophen  1,000 mg Oral Once  .  ceFAZolin (ANCEF) IV  2 g Intravenous On Call to OR  . chlorhexidine  60 mL Topical Once   Continuous Infusions: . dextrose 5 % and 0.45% NaCl    . dextrose 5 % and 0.9% NaCl 50 mL/hr (07/17/13 1515)   PRN Meds:.acetaminophen, acetaminophen, morphine injection, prochlorperazine Assessment/Plan: Principal Problem:   Fracture, intertrochanteric, right femur Active Problems:   Underweight   Prolonged QT interval   Starvation ketoacidosis  Acute right femur fracture - given benign mechanism of injury, this represents a fragility fracture, likely representing osteoporosis. The patient's only risk factor is her low weight due to anorexia - orthopedics consulted, plan for operative repair this am - patient request to minimize pain medications as she does not like to use pain meds since her husband died from narcotics addiction.  Anorexia- The patient reports a multiple year history of having an eating d/o. This coincided with severe stress she was experiencing while her husband was addicted to  prescription pain meds and later heroin. Her weight had decreased to the 80 lb range, and would fluctuate from 80-90. This reached its worse around the time her husband committed suicide whilewithdrawing from narcotics (~6 months ago). She has since started eating three meals a day. She previously would eat one meal a day that consisted of ice cream. Now, she eats all types of food. Her weight has increased to 100 lbs. Her sister and children are aware of her eating d/o and provide her with support. She feels much better. She is very distrustful of physicians due to her experiences with her husbands addiction to prescription pain meds and the circumstances of his death. She has not sought medical care for help in regads to her eating d/o. - Mg (2.0), phos (5.1), Albumin 2.9 - will refer patient for outpatient psych  Elevated AG - resolved. likely represented starvation ketoacidosis.   Normocytic Anemia - no evidence for acute bleed. Likely related to nutritional deficiency.  -monitor CBC's  -B12 Nl, Folate nl, Iron (low) - recmmend starting PO iron and colace.  Prolonged QT - QTc = 526, though is also seen independently in anorexia nervosa  -avoid QT prolonging meds   Prophy - SCD's per ortho   Dispo: Disposition is deferred at this time, awaiting improvement of current medical problems.  Anticipated discharge in approximately 1-2 day(s).   The patient does have a current PCP Lupe Carney, MD) and does need an [facility] follow-up appointment after discharge.  The patient does have transportation limitations that hinder transportation to clinic appointments.  .Services Needed at time of discharge: Y = Yes, Blank = No PT:   OT:   RN:   Equipment:   Other:     LOS: 1 day   Pleas Koch, MD 07/18/2013, 7:44 AM

## 2013-07-19 ENCOUNTER — Encounter (HOSPITAL_COMMUNITY): Payer: Self-pay | Admitting: Orthopedic Surgery

## 2013-07-19 DIAGNOSIS — R630 Anorexia: Secondary | ICD-10-CM

## 2013-07-19 DIAGNOSIS — D649 Anemia, unspecified: Secondary | ICD-10-CM

## 2013-07-19 LAB — CBC
HCT: 24.5 % — ABNORMAL LOW (ref 36.0–46.0)
Hemoglobin: 8 g/dL — ABNORMAL LOW (ref 12.0–15.0)
MCH: 29.6 pg (ref 26.0–34.0)
MCHC: 32.7 g/dL (ref 30.0–36.0)
MCV: 90.7 fL (ref 78.0–100.0)
PLATELETS: 244 10*3/uL (ref 150–400)
RBC: 2.7 MIL/uL — AB (ref 3.87–5.11)
RDW: 13 % (ref 11.5–15.5)
WBC: 5.6 10*3/uL (ref 4.0–10.5)

## 2013-07-19 LAB — BASIC METABOLIC PANEL
BUN: 13 mg/dL (ref 6–23)
CALCIUM: 8.5 mg/dL (ref 8.4–10.5)
CO2: 28 meq/L (ref 19–32)
CREATININE: 0.65 mg/dL (ref 0.50–1.10)
Chloride: 101 mEq/L (ref 96–112)
GFR calc Af Amer: >90 mL/min (ref >90)
GFR calc non Af Amer: >90 mL/min (ref >90)
Glucose, Bld: 106 mg/dL — ABNORMAL HIGH (ref 70–99)
Potassium: 4 mEq/L (ref 3.7–5.3)
Sodium: 140 mEq/L (ref 137–147)

## 2013-07-19 LAB — HIV ANTIBODY (ROUTINE TESTING W REFLEX): HIV: NONREACTIVE

## 2013-07-19 LAB — VITAMIN D 25 HYDROXY (VIT D DEFICIENCY, FRACTURES): Vit D, 25-Hydroxy: 26 ng/mL — ABNORMAL LOW (ref 30–89)

## 2013-07-19 MED ORDER — DOCUSATE SODIUM 100 MG PO CAPS
100.0000 mg | ORAL_CAPSULE | Freq: Every day | ORAL | Status: DC
  Filled 2013-07-19: qty 1

## 2013-07-19 MED ORDER — FERROUS SULFATE 325 (65 FE) MG PO TABS
325.0000 mg | ORAL_TABLET | Freq: Two times a day (BID) | ORAL | Status: DC
  Administered 2013-07-19 – 2013-07-20 (×2): 325 mg via ORAL
  Filled 2013-07-19 (×4): qty 1

## 2013-07-19 NOTE — Progress Notes (Signed)
Subjective:  Patient did well ON, no complaints this AM. Hg dropped from 11 to 8.  Objective: Vital signs in last 24 hours: Filed Vitals:   07/19/13 0000 07/19/13 0100 07/19/13 0400 07/19/13 0652  BP:  112/61  107/53  Pulse:  94  98  Temp:  98.3 F (36.8 C)  98.2 F (36.8 C)  TempSrc:      Resp: 18 18 16 16   Height:      Weight:      SpO2: 100% 100% 100% 98%   Weight change:   Intake/Output Summary (Last 24 hours) at 07/19/13 1148 Last data filed at 07/19/13 0730  Gross per 24 hour  Intake 2830.66 ml  Output    500 ml  Net 2330.66 ml   Physical Exam  Constitutional: She is oriented to person, place, and time. No distress.  cachectic  Cardiovascular: Normal rate, regular rhythm, normal heart sounds and intact distal pulses.  Exam reveals no gallop and no friction rub.   No murmur heard. Pulmonary/Chest: Effort normal and breath sounds normal. No respiratory distress. She has no wheezes. She has no rales.  Neurological: She is alert and oriented to person, place, and time.  Skin: She is not diaphoretic.  Psychiatric: She has a normal mood and affect. Her behavior is normal.   Lab Results: Basic Metabolic Panel:  Recent Labs Lab 07/17/13 1033 07/17/13 1405 07/18/13 0556 07/19/13 0542  NA 141  --  141 140  K 4.2  --  4.1 4.0  CL 97  --  103 101  CO2 27  --  29 28  GLUCOSE 116*  --  98 106*  BUN 22  --  18 13  CREATININE 0.68  --  0.64 0.65  CALCIUM 9.7  --  8.7 8.5  MG  --  2.0  --   --   PHOS  --  5.1*  --   --    Liver Function Tests:  Recent Labs Lab 07/17/13 1405 07/18/13 1630  AST 22  --   ALT 23  --   ALKPHOS 85  --   BILITOT 0.4  --   PROT 5.9*  --   ALBUMIN 2.9* 2.9*   CBC:  Recent Labs Lab 07/17/13 1033 07/19/13 0542  WBC 7.5 5.6  HGB 11.0* 8.0*  HCT 32.9* 24.5*  MCV 88.7 90.7  PLT 298 244   Coagulation:  Recent Labs Lab 07/17/13 1033  LABPROT 12.3  INR 0.93   Anemia Panel:  Recent Labs Lab 07/17/13 1405    VITAMINB12 451  FOLATE 15.7  FERRITIN 76  TIBC 281  IRON 30*  RETICCTPCT 2.3    Studies/Results: Dg Hip Operative Right  07/18/2013   CLINICAL DATA Hip fracture  EXAM DG OPERATIVE RIGHT HIP  TECHNIQUE A single spot fluoroscopic AP image of the right hip is submitted.  COMPARISON 07/17/2013  FINDINGS Intertrochanteric fracture has been fixed with a compression screw and right femur rod. Fracture alignment is satisfactory. Hardware position is satisfactory.  IMPRESSION Satisfactory  right intertrochanteric fracture fixation.  SIGNATURE  Electronically Signed   By: Marlan Palau M.D.   On: 07/18/2013 16:27   Medications: I have reviewed the patient's current medications. Scheduled Meds: . aspirin EC  325 mg Oral Q breakfast  . docusate sodium  100 mg Oral BID   Continuous Infusions: . dextrose 5 % and 0.9% NaCl 50 mL/hr (07/17/13 1515)  . lactated ringers 20 mL/hr at 07/18/13 1150   PRN Meds:.acetaminophen,  acetaminophen, menthol-cetylpyridinium, morphine injection, oxyCODONE, phenol, prochlorperazine Assessment/Plan: Principal Problem:   Fracture, intertrochanteric, right femur Active Problems:   Underweight   Prolonged QT interval   Starvation ketoacidosis  Acute right femur fracture - given benign mechanism of injury, this represents a fragility fracture due to osteoporosis. The patient's only risk factor is her low weight due to anorexia - orthopedics repaired fracture on 3/11 - patient request to minimize pain medications as she does not like to use pain meds since her husband died from narcotics addiction. - WBAT per ortho  Anorexia- The patient reports a multiple year history of having an eating d/o. This coincided with severe stress she was experiencing while her husband was addicted to prescription pain meds and later heroin. Her weight had decreased to the 80 lb range, and would fluctuate from 80-90. This reached its worse around the time her husband committed suicide  whilewithdrawing from narcotics (~6 months ago). She has since started eating three meals a day. She previously would eat one meal a day that consisted of ice cream. Now, she eats all types of food. Her weight has increased to 100 lbs. Her sister and children are aware of her eating d/o and provide her with support. She feels much better. She is very distrustful of physicians due to her experiences with her husbands addiction to prescription pain meds and the circumstances of his death. She has not sought medical care for help in regads to her eating d/o. - Mg (2.0), phos (5.1), Albumin 2.9 - will refer patient for outpatient psych and look for an anorexia treatment center  Elevated AG - resolved. likely represented starvation ketoacidosis. Resolved  Normocytic Anemia - no evidence for acute bleed. Likely related to nutritional deficiency.  -monitor CBC's  -B12 Nl, Folate nl, Iron (low) - recmmend starting PO iron and colace.  Prolonged QT - QTc = 526, though is also seen independently in anorexia nervosa  -avoid QT prolonging meds (no zofran)  Prophy - SCD's per ortho   Dispo: Disposition is deferred at this time, awaiting improvement of current medical problems.  Anticipated discharge in approximately 1-2 day(s).   The patient does have a current PCP Lupe Carney(Dean Mitchell, MD) and [facility] after discharge.  The patient does have transportation limitations that hinder transportation to clinic appointments.  .Services Needed at time of discharge: Y = Yes, Blank = No PT:   OT:   RN:   Equipment:   Other:     LOS: 2 days   Pleas Kochhristopher Inna Tisdell, MD 07/19/2013, 11:48 AM

## 2013-07-19 NOTE — Clinical Documentation Improvement (Signed)
Possible Clinical Conditions?  Severe Malnutrition   Protein Calorie Malnutrition Severe Protein Calorie Malnutrition Other Condition Cannot clinically determine  Supporting Information:  Per 07/18/13 Nutritional Consult:  Pt meets criteria for severe MALNUTRITION in the context of chronic illness as evidenced by severe subcutaneous fat and muscle wasting.    Height:  Ht Readings from Last 1 Encounters:  07/17/13  5\' 9"  (1.753 m)      Weight:  Wt Readings from Last 1 Encounters:  07/17/13  100 lb (45.36 kg)      Ideal Body Weight: 145 lbs   % Ideal Body Weight: 68%   Wt Readings from Last 10 Encounters:  07/17/13  100 lb (45.36 kg)  07/17/13  100 lb (45.36 kg)   INTERVENTION:   1. General healthful diet; encourage intake of foods and beverages as able. RD to follow and assess for nutritional adequacy.   Thank You, Shelda Palarlene H Cooper RN, BSN, CCM Clinical Documentation Specialist [facility]- Health Information Management [phone]

## 2013-07-19 NOTE — Progress Notes (Signed)
Internal Medicine Attending  Date: 07/19/2013  Patient name: Jasmine OharaLisa Wagner Medical record number: [id number] Date of birth: 09/07/1970 Age: 43 y.o. Gender: female  I saw and evaluated the patient, and discussed her care on A.M rounds with housestaff.  I reviewed the resident's note by Dr. Glendell DockerKomanski and I agree with the resident's findings and plans as documented in his note.

## 2013-07-19 NOTE — Evaluation (Signed)
Physical Therapy Evaluation Patient Details Name: Jasmine Wagner MRN: [id number] DOB: 04-14-1971 Today's Date: 07/19/2013 Time: 4098-1191 PT Time Calculation (min): 28 min  PT Assessment / Plan / Recommendation History of Present Illness  Pt. admitted with IT fx R femur, s/p IM nail.  Pt reports tripping over the cat and falling.  She has severe malnutrition  Clinical Impression  Pt. Presents to PT with a decrease in her usual level of mobility and gait.  She will need acute PT to address these and below issues so she can DC home with family support.  Of note, pt. Indicates that she used to walk "2 hours a day" but says that it was recommended that she not exercise so much and she stopped doing so.  Pt. Was highly pleased and smiling with  her efforts in PT today .       PT Assessment  Patient needs continued PT services    Follow Up Recommendations  No PT follow up;Supervision/Assistance - 24 hour (suspect she will not need HHPT but will update as necessary)    Does the patient have the potential to tolerate intense rehabilitation      Barriers to Discharge        Equipment Recommendations  Rolling walker with 5" wheels;3in1 (PT) (pt. is 5\' 9"  and 100 lbs)    Recommendations for Other Services     Frequency Min 6X/week    Precautions / Restrictions Precautions Precautions:  (order states "posterior hip precautions if THA:") Restrictions Weight Bearing Restrictions: Yes RLE Weight Bearing: Weight bearing as tolerated   Pertinent Vitals/Pain See vitals tab Pt. Did not c/o pain with any mobility she attempted      Mobility  Bed Mobility Overal bed mobility: Needs Assistance Bed Mobility: Supine to Sit Supine to sit: Min assist;HOB elevated General bed mobility comments: min assist for R LE initially then pt. managed herself, HOB elevated.  Pt. was mildly anxious and expected rtremendous pain; she was pleasantly surprised that pain was well managed for bed mobility  activity Transfers Overall transfer level: Needs assistance Equipment used: Rolling walker (2 wheeled) Transfers: Sit to/from Stand Sit to Stand: Min guard General transfer comment: min guard assist for safety and steadying initially Ambulation/Gait Ambulation/Gait assistance: Min guard Ambulation Distance (Feet): 100 Feet Assistive device: Rolling walker (2 wheeled) Gait Pattern/deviations: Step-to pattern;Decreased step length - right;Decreased step length - left Gait velocity: decreased Gait velocity interpretation: Below normal speed for age/gender General Gait Details: pt. needed vc's for sequencing and correct RW and hand placement, min guard assist for safety and steadying initially    Exercises General Exercises - Lower Extremity Ankle Circles/Pumps: AROM;Both;10 reps Quad Sets: AROM;Both;10 reps   PT Diagnosis: Difficulty walking  PT Problem List: Decreased activity tolerance;Decreased mobility;Decreased knowledge of use of DME;Decreased balance PT Treatment Interventions: DME instruction;Gait training;Stair training;Functional mobility training;Therapeutic exercise;Patient/family education     PT Goals(Current goals can be found in the care plan section) Acute Rehab PT Goals Patient Stated Goal: home to her 5 children and with parents assisting PT Goal Formulation: With patient Time For Goal Achievement: 07/26/13 Potential to Achieve Goals: Good  Visit Information  Last PT Received On: 07/19/13 Assistance Needed: +1 History of Present Illness: Pt. admitted with IT fx R femur, s/p IM nail.  Pt reports tripping over the cat and falling.  She has severe malnutrition       Prior Functioning  Home Living Family/patient expects to be discharged to:: Private residence Living Arrangements: Children;Parent (kids,  18, [id number],14,12,10) Available Help at Discharge: Family;Available 24 hours/day (Dad can be available  24/7) Type of Home: House Home Access: Stairs to  enter Entergy CorporationEntrance Stairs-Number of Steps: 3 Entrance Stairs-Rails: Right;Left;Can reach both Home Layout: Multi-level;1/2 bath on main level Alternate Level Stairs-Number of Steps: 12 (with landing in between) Alternate Level Stairs-Rails: Right;Left Home Equipment: None Prior Function 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 with mild anxiousness but did not limit PT session) Overall Cognitive Status: Within Functional Limits for tasks assessed    Extremity/Trunk Assessment Upper Extremity Assessment Upper Extremity Assessment: Overall WFL for tasks assessed Lower Extremity Assessment Lower Extremity Assessment: RLE deficits/detail RLE Deficits / Details: good ankle pump and quad set Cervical / Trunk Assessment Cervical / Trunk Assessment: Normal   Balance Balance Overall balance assessment: Needs assistance Sitting-balance support: Feet supported Sitting balance-Leahy Scale: Normal Standing balance support: Bilateral upper extremity supported;During functional activity Standing balance-Leahy Scale: Poor (due to need for RW only) Standing balance comment: pt. with no overt LOB with use of RW in standing  End of Session PT - End of Session Equipment Utilized During Treatment: Gait belt Activity Tolerance: Patient tolerated treatment well Patient left: in chair;with call bell/phone within reach;with nursing/sitter in room Nurse Communication: Mobility status  GP     Ferman HammingBlankenship, Guinevere Stephenson B 07/19/2013, 4:31 PM Weldon PickingSusan Nathan Moctezuma PT Acute Rehab Services [phone] Beeper [phone]

## 2013-07-19 NOTE — Progress Notes (Signed)
I participated in the care of this patient and agree with the above history, physical and evaluation. I performed a review of the history and a physical exam as detailed   Lunabelle Oatley Daniel Jeiden Daughtridge MD  

## 2013-07-19 NOTE — ED Provider Notes (Signed)
Medical screening examination/treatment/procedure(s) were performed by non-physician practitioner and as supervising physician I was immediately available for consultation/collaboration.   EKG Interpretation   Date/Time:  Tuesday July 17 2013 11:30:03 EDT Ventricular Rate:  107 PR Interval:  172 QRS Duration: 68 QT Interval:  394 QTC Calculation: 526 R Axis:   87 Text Interpretation:  Sinus rhythm PVC Prolonged QT interval @  526  Confirmed by Fayrene FearingJAMES  MD, Vasilisa Vore ([id number]) on 07/17/2013 11:54:32 AM        Rolland PorterMark Justn Quale, MD 07/19/13 1053

## 2013-07-19 NOTE — Progress Notes (Signed)
Patient ID: Jasmine OharaLisa Wagner, female   DOB: 11/12/1970, 43 y.o.   MRN: [id number]     Subjective:  Patient reports pain as mild.  Patient sitting up in bed in no acute distress.  Denies any CP or SOB.    Objective:   VITALS:   Filed Vitals:   07/19/13 0000 07/19/13 0100 07/19/13 0400 07/19/13 0652  BP:  112/61  107/53  Pulse:  94  98  Temp:  98.3 F (36.8 C)  98.2 F (36.8 C)  TempSrc:      Resp: 18 18 16 16   Height:      Weight:      SpO2: 100% 100% 100% 98%    ABD soft Sensation intact distally Dorsiflexion/Plantar flexion intact Incision: dressing C/D/I and no drainage   Lab Results  Component Value Date   WBC 5.6 07/19/2013   HGB 8.0* 07/19/2013   HCT 24.5* 07/19/2013   MCV 90.7 07/19/2013   PLT 244 07/19/2013     Assessment/Plan: 1 Day Post-Op   Principal Problem:   Fracture, intertrochanteric, right femur Active Problems:   Underweight   Prolonged QT interval   Starvation ketoacidosis   Advance diet Up with therapy ABLA not symptomatic however low for post-op day one. May plan for two units PRBC's WBAT  Dry dressing PRN   Haskel KhanDOUGLAS Marnie Fazzino 07/19/2013, 7:28 AM   Margarita Ranaimothy Murphy MD [phone]

## 2013-07-20 DIAGNOSIS — D62 Acute posthemorrhagic anemia: Secondary | ICD-10-CM

## 2013-07-20 DIAGNOSIS — E41 Nutritional marasmus: Secondary | ICD-10-CM

## 2013-07-20 LAB — BASIC METABOLIC PANEL
BUN: 14 mg/dL (ref 6–23)
CALCIUM: 8.5 mg/dL (ref 8.4–10.5)
CO2: 30 mEq/L (ref 19–32)
CREATININE: 0.67 mg/dL (ref 0.50–1.10)
Chloride: 102 mEq/L (ref 96–112)
Glucose, Bld: 109 mg/dL — ABNORMAL HIGH (ref 70–99)
POTASSIUM: 4.3 meq/L (ref 3.7–5.3)
Sodium: 141 mEq/L (ref 137–147)

## 2013-07-20 LAB — CBC
HEMATOCRIT: 23.1 % — AB (ref 36.0–46.0)
Hemoglobin: 7.7 g/dL — ABNORMAL LOW (ref 12.0–15.0)
MCH: 30.3 pg (ref 26.0–34.0)
MCHC: 33.3 g/dL (ref 30.0–36.0)
MCV: 90.9 fL (ref 78.0–100.0)
Platelets: 208 10*3/uL (ref 150–400)
RBC: 2.54 MIL/uL — ABNORMAL LOW (ref 3.87–5.11)
RDW: 12.9 % (ref 11.5–15.5)
WBC: 4.7 10*3/uL (ref 4.0–10.5)

## 2013-07-20 MED ORDER — ACETAMINOPHEN 325 MG PO TABS: 650.0000 mg | ORAL_TABLET | Freq: Four times a day (QID) | ORAL | Status: AC | PRN

## 2013-07-20 MED ORDER — FERROUS SULFATE 325 (65 FE) MG PO TABS
325.0000 mg | ORAL_TABLET | Freq: Two times a day (BID) | ORAL | Status: AC
Start: 2013-07-20 — End: ?

## 2013-07-20 MED ORDER — HYDROCODONE-ACETAMINOPHEN 5-325 MG PO TABS: 1.0000 | ORAL_TABLET | Freq: Four times a day (QID) | ORAL | Status: AC | PRN

## 2013-07-20 NOTE — Progress Notes (Addendum)
Subjective:  NAEON. Patient doing well. Walking with help. Ready for d/c.  Objective: Vital signs in last 24 hours: Filed Vitals:   07/19/13 0652 07/19/13 1436 07/19/13 2105 07/20/13 0501  BP: 107/53 124/63 103/44 110/50  Pulse: 98 92 95 85  Temp: 98.2 F (36.8 C) 97.6 F (36.4 C) 98.8 F (37.1 C) 98.3 F (36.8 C)  TempSrc:   Oral Oral  Resp: 16 16 16 16   Height:      Weight:      SpO2: 98% 100% 94% 99%   Weight change:   Intake/Output Summary (Last 24 hours) at 07/20/13 1106 Last data filed at 07/19/13 2200  Gross per 24 hour  Intake    800 ml  Output      0 ml  Net    800 ml   Physical Exam  Constitutional: She is oriented to person, place, and time. No distress.  cachectic  Cardiovascular: Normal rate, regular rhythm, normal heart sounds and intact distal pulses.  Exam reveals no gallop and no friction rub.   No murmur heard. Pulmonary/Chest: Effort normal and breath sounds normal. No respiratory distress. She has no wheezes. She has no rales.  Neurological: She is alert and oriented to person, place, and time.  Skin: She is not diaphoretic.  Psychiatric: She has a normal mood and affect. Her behavior is normal.   Lab Results: Basic Metabolic Panel:  Recent Labs Lab 07/17/13 1033 07/17/13 1405  07/19/13 0542 07/20/13 0432  NA 141  --   < > 140 141  K 4.2  --   < > 4.0 4.3  CL 97  --   < > 101 102  CO2 27  --   < > 28 30  GLUCOSE 116*  --   < > 106* 109*  BUN 22  --   < > 13 14  CREATININE 0.68  --   < > 0.65 0.67  CALCIUM 9.7  --   < > 8.5 8.5  MG  --  2.0  --   --   --   PHOS  --  5.1*  --   --   --   < > = values in this interval not displayed. Liver Function Tests:  Recent Labs Lab 07/17/13 1405 07/18/13 1630  AST 22  --   ALT 23  --   ALKPHOS 85  --   BILITOT 0.4  --   PROT 5.9*  --   ALBUMIN 2.9* 2.9*   CBC:  Recent Labs Lab 07/19/13 0542 07/20/13 0432  WBC 5.6 4.7  HGB 8.0* 7.7*  HCT 24.5* 23.1*  MCV 90.7 90.9  PLT 244  208   Coagulation:  Recent Labs Lab 07/17/13 1033  LABPROT 12.3  INR 0.93   Anemia Panel:  Recent Labs Lab 07/17/13 1405  VITAMINB12 451  FOLATE 15.7  FERRITIN 76  TIBC 281  IRON 30*  RETICCTPCT 2.3    Studies/Results: Dg Hip Operative Right  07/18/2013   CLINICAL DATA Hip fracture  EXAM DG OPERATIVE RIGHT HIP  TECHNIQUE A single spot fluoroscopic AP image of the right hip is submitted.  COMPARISON 07/17/2013  FINDINGS Intertrochanteric fracture has been fixed with a compression screw and right femur rod. Fracture alignment is satisfactory. Hardware position is satisfactory.  IMPRESSION Satisfactory  right intertrochanteric fracture fixation.  SIGNATURE  Electronically Signed   By: Marlan Palauharles  Clark M.D.   On: 07/18/2013 16:27   Medications: I have reviewed the patient's current medications.  Scheduled Meds: . aspirin EC  325 mg Oral Q breakfast  . docusate sodium  100 mg Oral Daily  . ferrous sulfate  325 mg Oral BID WC   Continuous Infusions: . dextrose 5 % and 0.9% NaCl 50 mL/hr (07/17/13 1515)  . lactated ringers 20 mL/hr at 07/18/13 1150   PRN Meds:.acetaminophen, acetaminophen, menthol-cetylpyridinium, morphine injection, oxyCODONE, phenol, prochlorperazine Assessment/Plan: Principal Problem:   Fracture, intertrochanteric, right femur Active Problems:   Underweight   Prolonged QT interval   Starvation ketoacidosis  Acute right femur fracture - given benign mechanism of injury this represents a fragility fracture due to osteoporosis. The patient's only risk factor is her low weight due to anorexia - orthopedics repaired fracture on 3/11 - WBAT, d/c home. - ASA for DVT prophylaxis - vicodin 5/325 mg for pain (did not tolerate percocet previously - d/c zofran due to prolonged QT  Anorexia c/b severe malnutrition- The patient reports a multiple year history of having an eating d/o. This coincided with severe stress she was experiencing while her husband was addicted  to prescription pain meds and later heroin. Her weight had decreased to the 80 lb range, and would fluctuate from 80-90. This reached its worse around the time her husband committed suicide whilewithdrawing from narcotics (~6 months ago). She has since started eating three meals a day. She previously would eat one meal a day that consisted of ice cream. Now, she eats all types of food. Her weight has increased to 100 lbs. Her sister and children are aware of her eating d/o and provide her with support. She feels much better. She is very distrustful of physicians due to her experiences with her husbands addiction to prescription pain meds and the circumstances of his death. She has not sought medical care for help in regads to her eating d/o. - Mg (2.0), phos (5.1), Albumin 2.9 - I rec the patient for outpatient eating disorder management.  Elevated AG - resolved. likely represented starvation ketoacidosis. Resolved  Acute blood loss anemia on chronic Normocytic Anemia - Bleed due to blood loss in sx, now stabilied. Likely related to nutritional deficiency.  -monitor CBC's  -B12 Nl, Folate nl, Iron (low) - recmmend starting PO iron and colace.  Prolonged QT - QTc = 526, though is also seen independently in anorexia nervosa  -avoid QT prolonging meds (no zofran)  Prophy - SCD's per ortho   Dispo: D/C today.   The patient does have a current PCP Lupe Carney, MD) and does need an [facility] hospital follow-up appointment after discharge.  The patient does have transportation limitations that hinder transportation to clinic appointments.  .Services Needed at time of discharge: Y = Yes, Blank = No PT:   OT:   RN:   Equipment:   Other:     LOS: 3 days   Pleas Koch, MD 07/20/2013, 11:06 AM

## 2013-07-20 NOTE — Progress Notes (Signed)
Patient ID: Jasmine OharaLisa Churchill, female   DOB: 09/02/1970, 43 y.o.   MRN: [id number]     Subjective:  Patient reports pain as mild and patient states that PT went well yesterday and that she is looking forward to going home.  Patient denies any dizziness CP or SOB  Objective:   VITALS:   Filed Vitals:   07/19/13 0652 07/19/13 1436 07/19/13 2105 07/20/13 0501  BP: 107/53 124/63 103/44 110/50  Pulse: 98 92 95 85  Temp: 98.2 F (36.8 C) 97.6 F (36.4 C) 98.8 F (37.1 C) 98.3 F (36.8 C)  TempSrc:   Oral Oral  Resp: 16 16 16 16   Height:      Weight:      SpO2: 98% 100% 94% 99%    ABD soft Sensation intact distally Dorsiflexion/Plantar flexion intact Incision: dressing C/D/I and no drainage   Lab Results  Component Value Date   WBC 4.7 07/20/2013   HGB 7.7* 07/20/2013   HCT 23.1* 07/20/2013   MCV 90.9 07/20/2013   PLT 208 07/20/2013     Assessment/Plan: 2 Days Post-Op   Principal Problem:   Fracture, intertrochanteric, right femur Active Problems:   Underweight   Prolonged QT interval   Starvation ketoacidosis   Advance diet Up with therapy ABLA not symptomatic Continue plan per medicine WBAT Dry dressing PRN   Haskel KhanDOUGLAS Francheska Villeda 07/20/2013, 7:27 AM   Margarita Ranaimothy Murphy MD [phone]

## 2013-07-20 NOTE — Progress Notes (Signed)
Occupational Therapy Evaluation and Discharge Patient Details Name: Jasmine Wagner MRN: [id number] DOB: 05/22/1970 Today's Date: 07/20/2013 Time: 1010-1045 OT Time Calculation (min): 35 min  OT Assessment / Plan / Recommendation History of present illness Pt. admitted with IT fx R femur, s/p IM nail.  Pt reports tripping over the cat and falling.  She has severe malnutrition   Clinical Impression   PTA pt lived at home with her 5 children and was independent in ADLs and mobility. Education and training provided today for compensatory technique for LB dressing, use of leg lifter for bed mobility, and demonstration of AE hip kit for LB dressing. Pt is able to bend R leg enough to reach socks and reports that she will have 24/7 assistance at home initially for help with ADLs as needed. No further acute OT needs at this time.     OT Assessment  Patient does not need any further OT services    Follow Up Recommendations  No OT follow up;Supervision/Assistance - 24 hour       Equipment Recommendations  None recommended by OT          Precautions / Restrictions Precautions Precautions: None (order states "posterior hip precautions if THA") Restrictions Weight Bearing Restrictions: Yes RLE Weight Bearing: Weight bearing as tolerated   Pertinent Vitals/Pain 1/10 at rest in RLE    ADL  Eating/Feeding: Independent Where Assessed - Eating/Feeding: Chair Grooming: Supervision/safety Where Assessed - Grooming: Supported standing Upper Body Bathing: Set up Where Assessed - Upper Body Bathing: Unsupported sitting Lower Body Bathing: Modified independent (with AE) Where Assessed - Lower Body Bathing: Supported sit to stand Upper Body Dressing: Set up Where Assessed - Upper Body Dressing: Unsupported sitting Lower Body Dressing: Modified independent (with AE or taking longer than same aged peers) Where Assessed - Lower Body Dressing: Supported sit to Lobbyist: Modified  independent Armed forces technical officer Method: Sit to Loss adjuster, chartered: Raised toilet seat with arms (or 3-in-1 over toilet) Tub/Shower Transfer: Supervision/safety (for backwards technique) Tub/Shower Transfer Method: Ambulating Equipment Used: Rolling walker;Leg lifter ADL Comments: Pt able to bend R leg to reach socks but will have assistance 24/7 initially as she gains more mobility in R leg for ADLs. Demonstrated leg lifter and pt stated that she will benefit from use and plans to get one once she returns home.         Visit Information  Last OT Received On: 07/20/13 Assistance Needed: +1 History of Present Illness: Pt. admitted with IT fx R femur, s/p IM nail.  Pt reports tripping over the cat and falling.  She has severe malnutrition       Prior Functioning     Home Living Family/patient expects to be discharged to:: Private residence Living Arrangements: Children;Parent Available Help at Discharge: Family;Available 24 hours/day Type of Home: House Home Access: Stairs to enter [employer] of Steps: 3 Entrance Stairs-Rails: Right;Left;Can reach both Home Layout: Multi-level;1/2 bath on main level Alternate Level Stairs-Number of Steps: 12 (with landing) Alternate Level Stairs-Rails: Right;Left Home Equipment: Bedside commode;Walker - 2 wheels (as recommended by PT) Prior Function Level of Independence: Independent Communication Communication: No difficulties         Vision/Perception Vision - History Patient Visual Report: No change from baseline   Cognition  Cognition Arousal/Alertness: Awake/alert Behavior During Therapy: WFL for tasks assessed/performed Overall Cognitive Status: Within Functional Limits for tasks assessed    Extremity/Trunk Assessment Upper Extremity Assessment Upper Extremity Assessment: Overall WFL for tasks assessed Lower  Extremity Assessment Lower Extremity Assessment: Defer to PT evaluation     Mobility Bed  Mobility Overal bed mobility: Needs Assistance Bed Mobility: Sit to Supine Supine to sit: HOB elevated;Supervision (using leg lifter) Sit to supine: Supervision;HOB elevated (using leg lifter) General bed mobility comments: Pt with supervision for bed mobility using leg lifter for technique.  Transfers Overall transfer level: Modified independent Equipment used: Rolling walker (2 wheeled) Transfers: Sit to/from Stand Sit to Stand: Modified independent (Device/Increase time) General transfer comment: mod I today with appropriate technique and no physical assist needed           End of Session OT - End of Session Equipment Utilized During Treatment: Rolling walker;Other (comment) (leg lifter) Activity Tolerance: Patient tolerated treatment well Patient left: in bed;with call bell/phone within reach       Juluis Rainier [phone] 07/20/2013, 10:54 AM

## 2013-07-20 NOTE — Discharge Summary (Signed)
Name: Jasmine Wagner MRN: [id number] DOB: 12/03/1970 43 y.o. PCP: Lupe Carneyean Mitchell, MD  Date of Admission: 07/17/2013  9:04 AM Date of Discharge: 07/20/2013 Attending Physician: Farley LyJerry Dale Joines, MD  Discharge Diagnosis:  Principal Problem:   Fracture, intertrochanteric, right femur Active Problems:   Anorexia nervosa   Prolonged QT interval   Starvation ketoacidosis  Discharge Medications:   Medication List         acetaminophen 325 MG tablet  Commonly known as:  TYLENOL  Take 2 tablets (650 mg total) by mouth every 6 (six) hours as needed for mild pain (or Fever >/= 101).     aspirin EC 325 MG tablet  Take 1 tablet (325 mg total) by mouth daily.     docusate sodium 100 MG capsule  Commonly known as:  COLACE  Take 1 capsule (100 mg total) by mouth 2 (two) times daily. Continue this while taking narcotics to help with bowel movements     ferrous sulfate 325 (65 FE) MG tablet  Take 1 tablet (325 mg total) by mouth 2 (two) times daily with a meal.     HYDROcodone-acetaminophen 5-325 MG per tablet  Commonly known as:  NORCO/VICODIN  Take 1 tablet by mouth every 6 (six) hours as needed for moderate pain or severe pain.     ibuprofen 200 MG tablet  Commonly known as:  ADVIL,MOTRIN  Take 200 mg by mouth every 6 (six) hours as needed for fever, headache or mild pain.     ondansetron 4 MG tablet  Commonly known as:  ZOFRAN  Take 1 tablet (4 mg total) by mouth every 8 (eight) hours as needed for nausea.     OVER THE COUNTER MEDICATION  Apply 1 application topically 3 (three) times daily as needed (for pain). Natural pain relief     oxyCODONE 5 MG immediate release tablet  Commonly known as:  ROXICODONE  Take 2 tablets (10 mg total) by mouth every 4 (four) hours as needed for severe pain.        Disposition and follow-up:   Ms.Selisa Gertie GowdaHartmann [facility].  At the hospital follow up visit please address:  1.  Femur  Fracture s/p surgical repair, Anorexia, Osteoporosis  2.  Labs / imaging needed at time of follow-up: CBC, EKG, Dexa Scan, Referral to nutritionist  3.  Pending labs/ test needing follow-up: None  Follow-up Appointments: Follow-up Information   Follow up with MURPHY, TIMOTHY, D, MD In 2 weeks.   Specialty:  Orthopedic Surgery   Contact information:   [address] [place] [address] [phone]       Follow up with Lupe CarneyMITCHELL,DEAN, MD On 08/03/2013. (11:30 am)    Specialty:  Family Medicine   Contact information:   [address] [place] [address] [phone]       Discharge Instructions: Discharge Orders   Future Orders Complete By Expires   Weight bearing as tolerated  As directed    Questions:     Laterality:     Extremity:        Consultations: Treatment Team:  Sheral Apleyimothy D Murphy, MD  Procedures Performed:  Dg Chest 2 View  07/17/2013   CLINICAL DATA:  Preoperative chest radiograph.  EXAM: CHEST  2 VIEW  COMPARISON:  None.  FINDINGS: Hyperinflation is present compatible with emphysema. Flattening of the hemidiaphragms. No airspace disease. Bilateral pleural apical scarring. Extreme apices are excluded from view.  Aortic arch atherosclerosis.  IMPRESSION: Emphysema without acute cardiopulmonary disease.   Electronically Signed   By: Andreas Newport M.D.   On: 07/17/2013 11:25   Dg Hip Complete Right  07/17/2013   CLINICAL DATA:  Pain post trauma  EXAM: RIGHT HIP - COMPLETE 2+ VIEW  COMPARISON:  None.  FINDINGS: Frontal pelvis as well as frontal and lateral right hip images were obtained. There is an impacted intertrochanteric femur fracture on the right. No other fracture. No dislocation. There is mild symmetric narrowing of both hip joints.  IMPRESSION: Impacted intertrochanteric femur fracture on the right.   Electronically Signed   By: Bretta Bang M.D.   On: 07/17/2013 10:14   Dg Hip Operative Right  07/18/2013   CLINICAL  DATA Hip fracture  EXAM DG OPERATIVE RIGHT HIP  TECHNIQUE A single spot fluoroscopic AP image of the right hip is submitted.  COMPARISON 07/17/2013  FINDINGS Intertrochanteric fracture has been fixed with a compression screw and right femur rod. Fracture alignment is satisfactory. Hardware position is satisfactory.  IMPRESSION Satisfactory  right intertrochanteric fracture fixation.  SIGNATURE  Electronically Signed   By: Marlan Palau M.D.   On: 07/18/2013 16:27   Admission HPI: The patient is a 43 yo woman, presenting with a hip fracture. Four days prior to admission, the patient was climbing over a fence, when she lost her balance and "did a split", with acute pain in her right groin. The pain has been persistent since that time, making the patient unable to bear weight on her right leg. She has been taking ibuprofen for the pain. She notes being able to keep up with PO intake of food and water, and notes no other symptoms. She notes no prior history of fractures, no family history of osteoporosis (mother with osteopenia in 44's), and no tobacco or alcohol usage, though the patient's BMI is currently 14.8, which the patient notes is around her baseline.  Hospital Course by problem list: Principal Problem:   Fracture, intertrochanteric, right femur Active Problems:   Underweight   Prolonged QT interval   Starvation ketoacidosis   Acute right femur fracture  Given benign mechanism of injury this represents a fragility fracture due to osteoporosis. The patient's only risk factor is her low weight due to anorexia. Repaired surgically on 3/11. Patient did well post-operatively. ASA for DVT prophylaxis. Gave vicodin 5/325 mg for pain (did not tolerate percocet previously). Did not prescribe zofran due to prolonged QT. Patient did not have health insurance to pay for PT. Encouraged patient to talk with PCP about accessing PT.  Anorexia c/b severe malnutrition-  The patient reports a multiple year  history of having an eating d/o. This coincided with severe stress she was experiencing while her husband was addicted to prescription pain meds and later heroin. Her weight had decreased to the 80 lb range, and would fluctuate from 80-90 lbs. This reached its worse around the time her husband committed suicide while withdrawing from narcotics (~6 months ago). She has since started eating three meals a day. She previously would eat one meal a day that consisted of ice cream. Now, she eats all types of food. Her weight has increased to 100 lbs. Her sister and children are aware of her eating d/o and provide her with support. She feels much better. She reports that she has previously been very distrustful of physicians due to her experiences with her husbands addiction to prescription pain meds and the circumstances of his death. She has  not sought medical care for help in regards to her eating d/o. Mg (2.0), phos (5.1), Albumin 2.9. I rec the patient undergo outpatient eating disorder management. I provided two options at discharge.  Elevated AG - resolved. likely represented starvation ketoacidosis.   Acute blood loss anemia on chronic Normocytic Anemia - Bleed due to blood loss in surgery, now stabilied. Chronic component likely related to nutritional deficiency. B12 and Foalte were normal. Iron was low. I recmmended starting PO iron and colace.   Prolonged QT - QTc = 526, Likely due to anorexia, this avoided QT prolonging meds (no zofran).  Discharge Vitals:   BP 110/50  Pulse 85  Temp(Src) 98.3 F (36.8 C) (Oral)  Resp 16  Ht 5\' 9"  (1.753 m)  Wt 100 lb (45.36 kg)  BMI 14.76 kg/m2  SpO2 99%  Discharge Labs:  Results for orders placed during the hospital encounter of 07/17/13 (from the past 24 hour(s))  CBC     Status: Abnormal   Collection Time    07/20/13  4:32 AM      Result Value Ref Range   WBC 4.7  4.0 - 10.5 K/uL   RBC 2.54 (*) 3.87 - 5.11 MIL/uL   Hemoglobin 7.7 (*) 12.0 - 15.0  g/dL   HCT 40.9 (*) 81.1 - 91.4 %   MCV 90.9  78.0 - 100.0 fL   MCH 30.3  26.0 - 34.0 pg   MCHC 33.3  30.0 - 36.0 g/dL   RDW 78.2  95.6 - 21.3 %   Platelets 208  150 - 400 K/uL  BASIC METABOLIC PANEL     Status: Abnormal   Collection Time    07/20/13  4:32 AM      Result Value Ref Range   Sodium 141  137 - 147 mEq/L   Potassium 4.3  3.7 - 5.3 mEq/L   Chloride 102  96 - 112 mEq/L   CO2 30  19 - 32 mEq/L   Glucose, Bld 109 (*) 70 - 99 mg/dL   BUN 14  6 - 23 mg/dL   Creatinine, Ser 0.86  0.50 - 1.10 mg/dL   Calcium 8.5  8.4 - 57.8 mg/dL   GFR calc non Af Amer >90  >90 mL/min   GFR calc Af Amer >90  >90 mL/min    Signed: Pleas Koch, MD 07/20/2013, 11:11 AM   Time Spent on Discharge: 40 minutes Services Ordered on Discharge: None Equipment Ordered on Discharge: None

## 2013-07-20 NOTE — Care Management Note (Signed)
CARE MANAGEMENT NOTE 07/20/2013  Patient:  Jasmine Wagner,Jasmine Wagner   Account Number:  [id number]  Date Initiated:  07/20/2013  Documentation initiated by:  Vance PeperBRADY,Katheline Brendlinger  Subjective/Objective Assessment:   43 yr old female s/p IM Nailing of right femur.     Action/Plan:   Physical therapy requested that patient receive HHPT. Patient is uninsured and [facility] will not be able to provide [facility] service.   Anticipated DC Date:  07/20/2013   Anticipated DC Plan:  HOME/SELF CARE      DC Planning Services  CM consult      PAC Choice  DURABLE MEDICAL EQUIPMENT   Choice offered to / List presented to:     DME arranged  3-N-1      DME agency  [facility]        Status of service:  Completed, signed off Medicare Important Message given?   (If response is "NO", the following Medicare IM given date fields will be blank) Date Medicare IM given:   Date Additional Medicare IM given:    Discharge Disposition:  HOME/SELF CARE

## 2013-07-20 NOTE — Progress Notes (Addendum)
Physical Therapy Treatment Patient Details Name: Rita OharaLisa Min MRN: [id number] DOB: 09/25/1970 Today's Date: 07/20/2013 Time: [id number] PT Time Calculation (min): 25 min  PT Assessment / Plan / Recommendation  History of Present Illness Pt. admitted with IT fx R femur, s/p IM nail.  Pt reports tripping over the cat and falling.  She has severe malnutrition   PT Comments    Pt. Much less anxious today, in good spirits and smiling.  She hopes to DC home today and is ready from the PT perspective.  OT still needs to see to address bathroom equipment and ADLs.  Pt. States she would feel more comfortable for PT to come out to check her in the home enviornment.  Due to her anxiousness yesterday in regards to her mobility and due to the fact that she has a flight of steps up to the bedroom, I will recommend a home safety eval but I do not believe she will need ongoing HHPT.  Discussed with CM Vance PeperSusan Brady  Follow Up Recommendations  Home health PT;Supervision/Assistance - 24 hour     Does the patient have the potential to tolerate intense rehabilitation     Barriers to Discharge        Equipment Recommendations  3in1 (PT);Other (comment) (pt. now states she has access to RW)    Recommendations for Other Services    Frequency Min 6X/week   Progress towards PT Goals Progress towards PT goals: Progressing toward goals  Plan Current plan remains appropriate    Precautions / Restrictions Precautions Precautions: None Restrictions Weight Bearing Restrictions: Yes RLE Weight Bearing: Weight bearing as tolerated   Pertinent Vitals/Pain See vitals tab Pain is not a limiting factor in this pt's gross mobility, and she reports only "soreness"    Mobility  Bed Mobility Overal bed mobility: Needs Assistance Bed Mobility: Sit to Supine Sit to supine: Min assist General bed mobility comments: Pt. up in bathroom with RN on PT arrival.  Once pt. finished PT session, she needed min assist for R LE to  get back into bed.  May bendfit from leg loop, will mention to OT.   Transfers Overall transfer level: Modified independent Equipment used: Rolling walker (2 wheeled) Transfers: Sit to/from Stand Sit to Stand: Modified independent (Device/Increase time) General transfer comment: mod I today with appropriate technique and no physical assist needed Ambulation/Gait Ambulation/Gait assistance: Modified independent (Device/Increase time) Ambulation Distance (Feet): 200 Feet Assistive device: Rolling walker (2 wheeled) Gait Pattern/deviations: Step-through pattern Gait velocity: improving Gait velocity interpretation: Below normal speed for age/gender General Gait Details: Pt. with no overt LOB noted, appeared stable with RW.  Needs occasional reminder to position RW slightly further ahead of her as she is 5\' 9"  and takes fairly long strides Stairs: Yes Stairs assistance: Min assist Stair Management: Two rails;One rail Right;Forwards Number of Stairs: 5 (5 in gym, then flight of steps in stairwell) General stair comments: Pt. at supervision level in gym on set of 5 steps with 2 rails.  Pt needed min hand hold assist to negotiate flight of steps with use of one rail on right.  Good technique and sequencing.  Actually was able to do reciprocal stepping on gym steps but pt. was encouraged to do step over step  at home doing her flight of steps.      Exercises     PT Diagnosis:    PT Problem List:   PT Treatment Interventions:     PT Goals (current goals can now be  found in the care plan section)    Visit Information  Last PT Received On: 07/20/13 Assistance Needed: +1 History of Present Illness: Pt. admitted with IT fx R femur, s/p IM nail.  Pt reports tripping over the cat and falling.  She has severe malnutrition    Subjective Data  Subjective: Pt. reports she feels so much better that she can walk into the bathroom and not have to use the bedpan.  She iis excited about possible Dc  today. and anxious to get home to her children.,   Cognition  Cognition Arousal/Alertness: Awake/alert Behavior During Therapy: WFL for tasks assessed/performed (pt. does not appear anxious today) Overall Cognitive Status: Within Functional Limits for tasks assessed    Balance     End of Session PT - End of Session Equipment Utilized During Treatment: Gait belt Activity Tolerance: Patient tolerated treatment well Patient left: in bed;with call bell/phone within reach Nurse Communication: Mobility status   GP     Ferman Hamming 07/20/2013, 9:47 AM Weldon Picking PT Acute Rehab Services [phone] Beeper [phone]

## 2013-07-20 NOTE — Discharge Instructions (Signed)
Bear weight as tolerated  Take aspirin 325 daily for 30 days  Please follow up with a specialist in eating disorders. Here are two options:  Elio ForgetJulie Dillon  [address] [phone]  [facility] [address] [phone]

## 2013-07-20 NOTE — Progress Notes (Signed)
Patient d/c to home, IV removed, prescriptions given and instructions reviewed. 

## 2013-07-21 LAB — VITAMIN B1: Vitamin B1 (Thiamine): 11 nmol/L (ref 8–30)

## 2013-07-27 NOTE — Progress Notes (Signed)
I participated in the care of this patient and agree with the above history, physical and evaluation. I performed a review of the history and a physical exam as detailed   Eutimio Gharibian Daniel Yoneko Talerico MD  

## 2015-05-10 IMAGING — CR DG HIP COMPLETE 2+V*R*
3 series · 3 of 3 positions shown · non-contrast
Comparison: None.

CLINICAL DATA: Pain post trauma

EXAM:
RIGHT HIP - COMPLETE 2+ VIEW

[t pelvis ap]
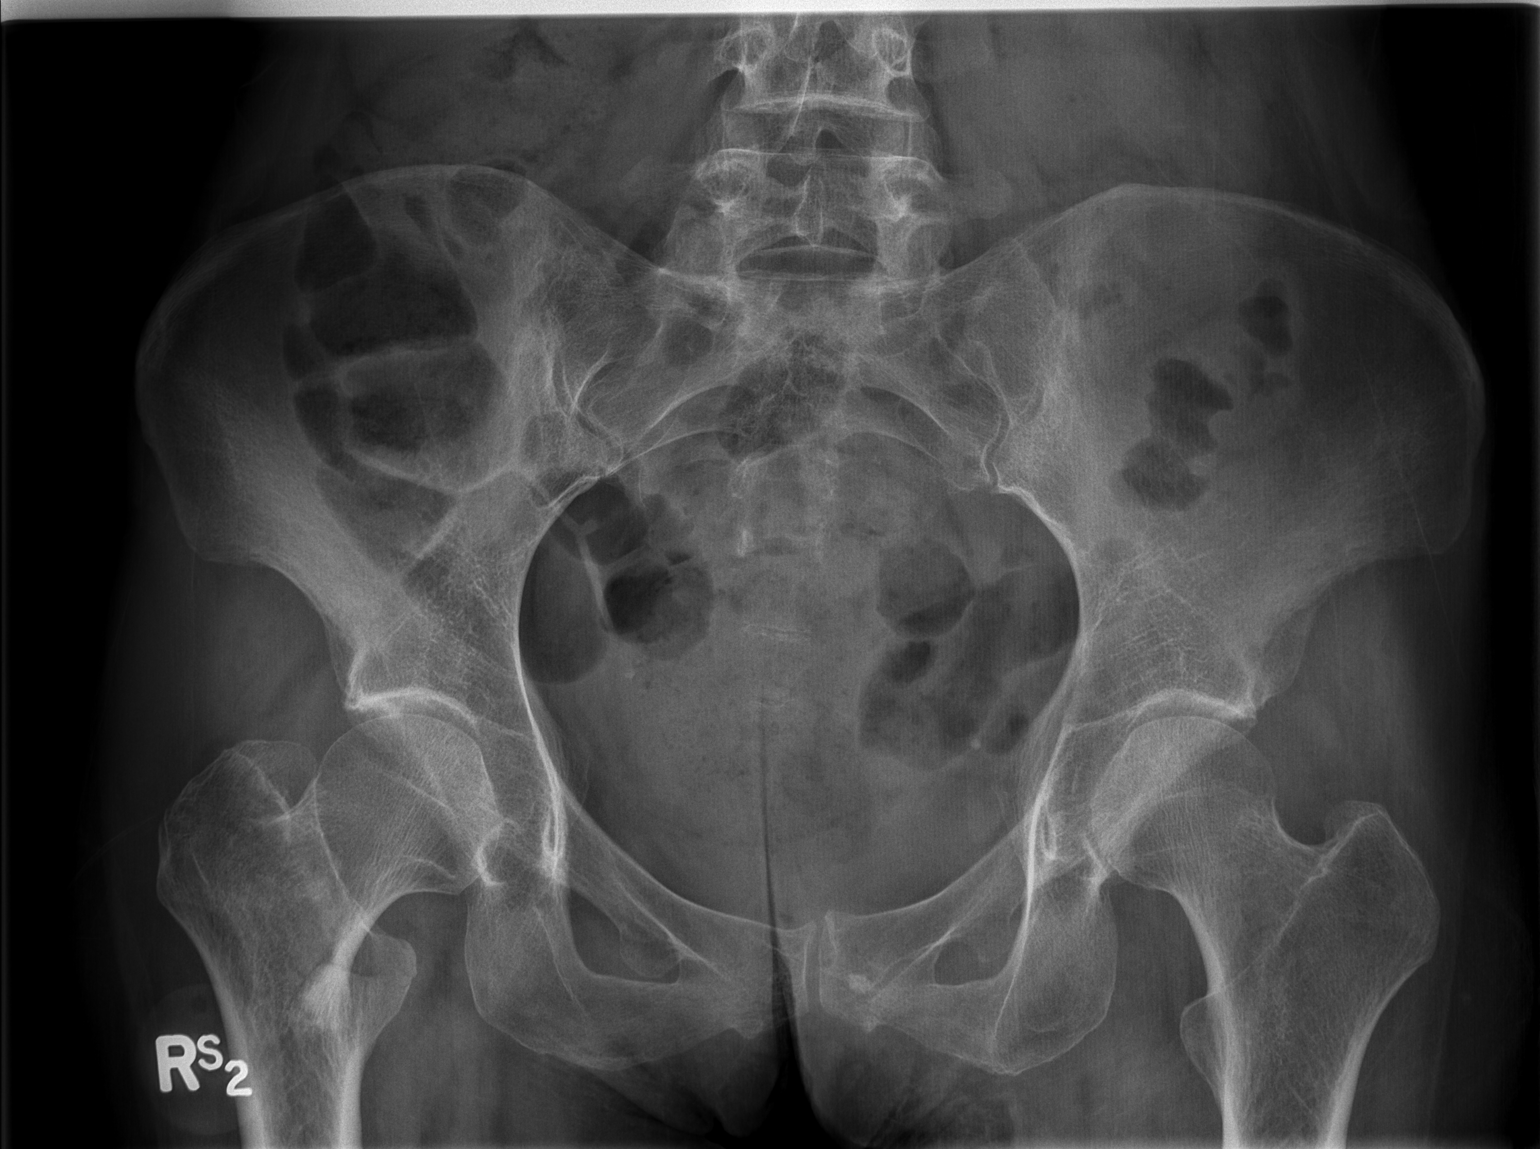

[t hip ap right]
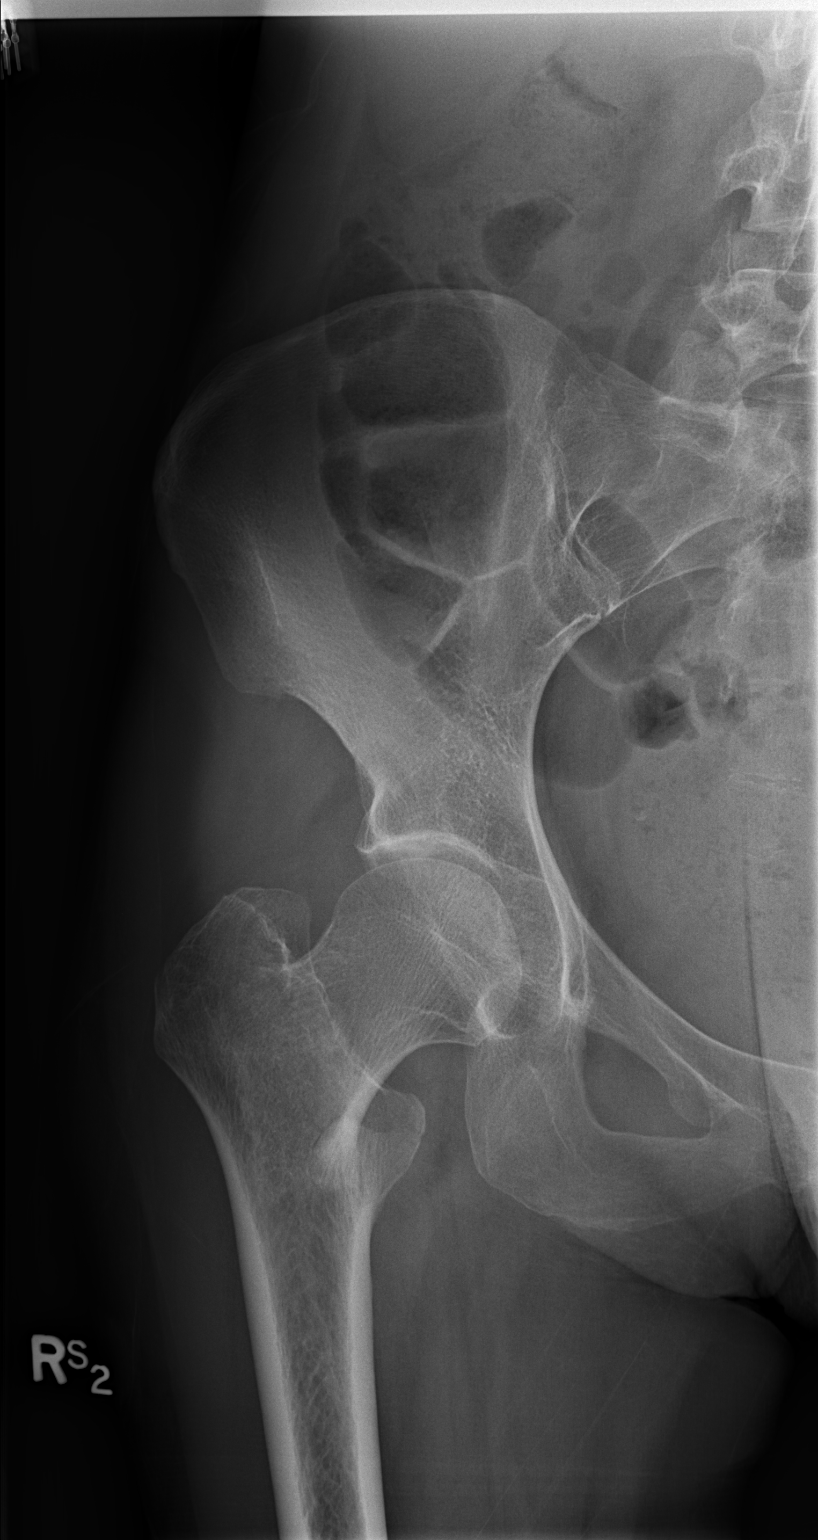

[t hip frog leg right]
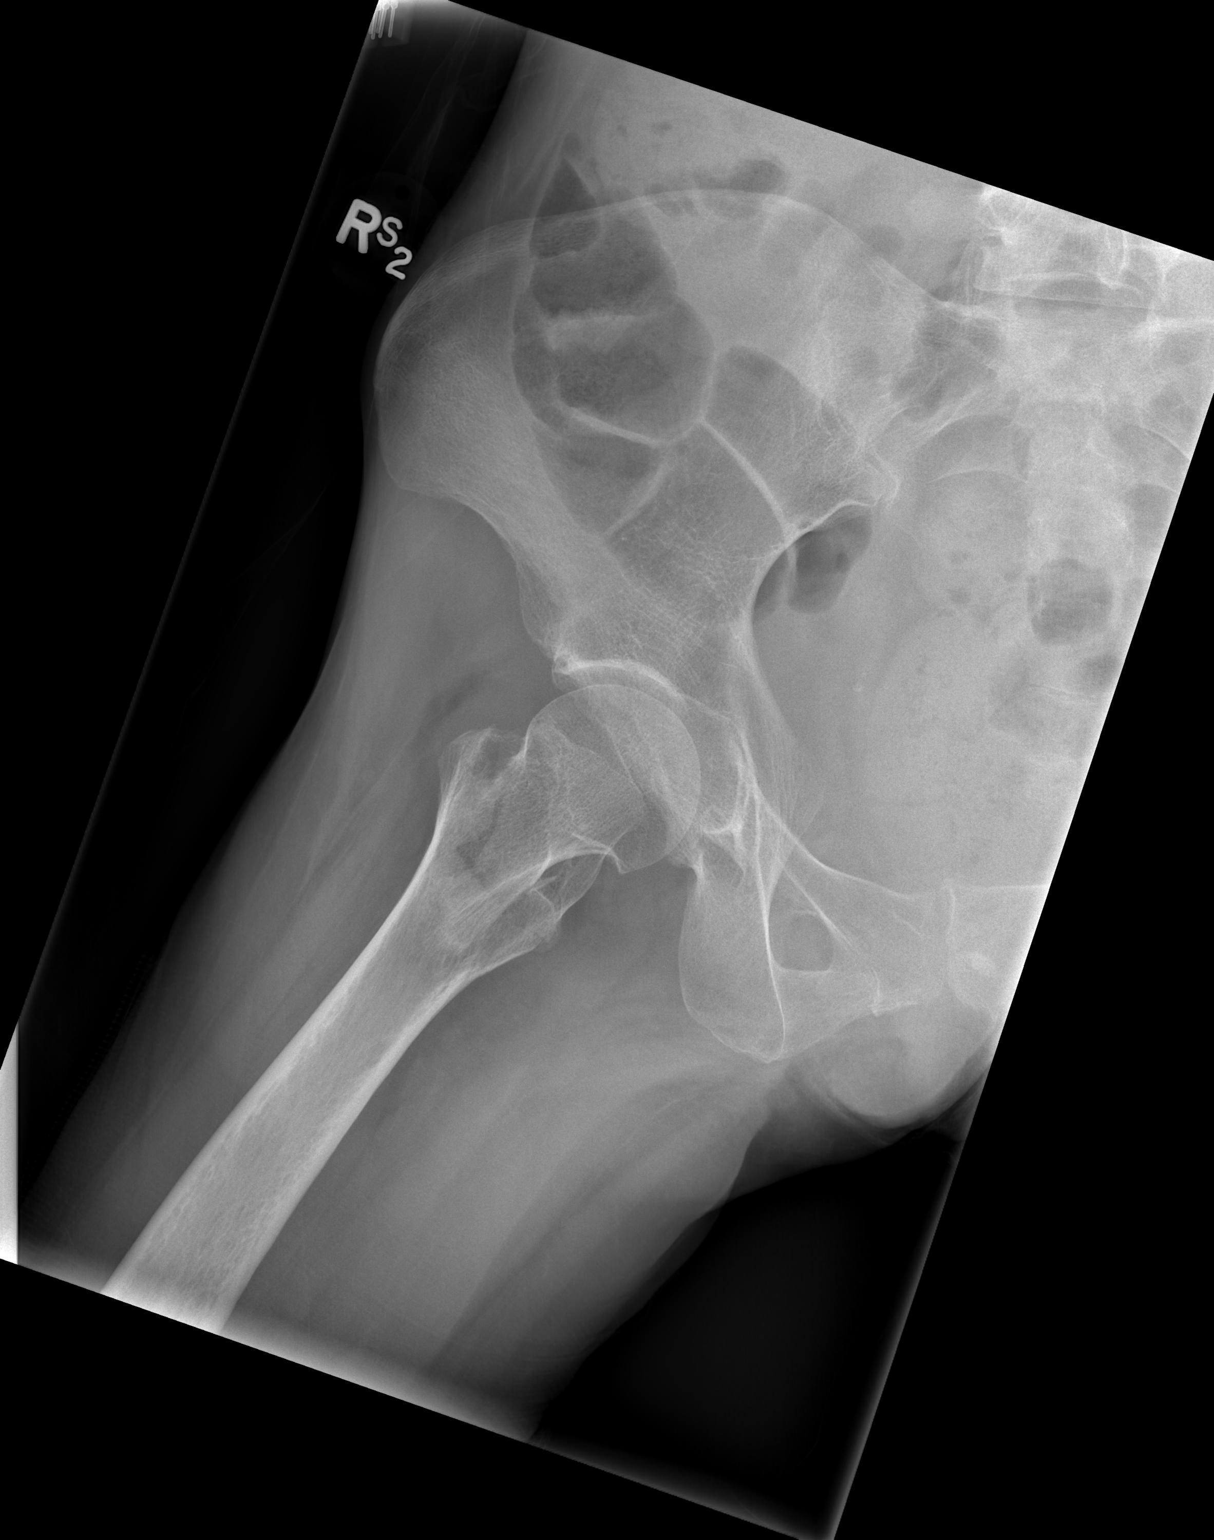

[3 of 3 positions shown; findings below may reference images not displayed]

FINDINGS: Frontal pelvis as well as frontal and lateral right hip images were
obtained. There is an impacted intertrochanteric femur fracture on
the right. No other fracture. No dislocation. There is mild
symmetric narrowing of both hip joints.
IMPRESSION: Impacted intertrochanteric femur fracture on the right.

## 2015-05-10 IMAGING — CR DG CHEST 2V
1 series · 1 of 1 positions shown · non-contrast
Comparison: None.

CLINICAL DATA: Preoperative chest radiograph.

EXAM:
CHEST  2 VIEW

[w chest lat]
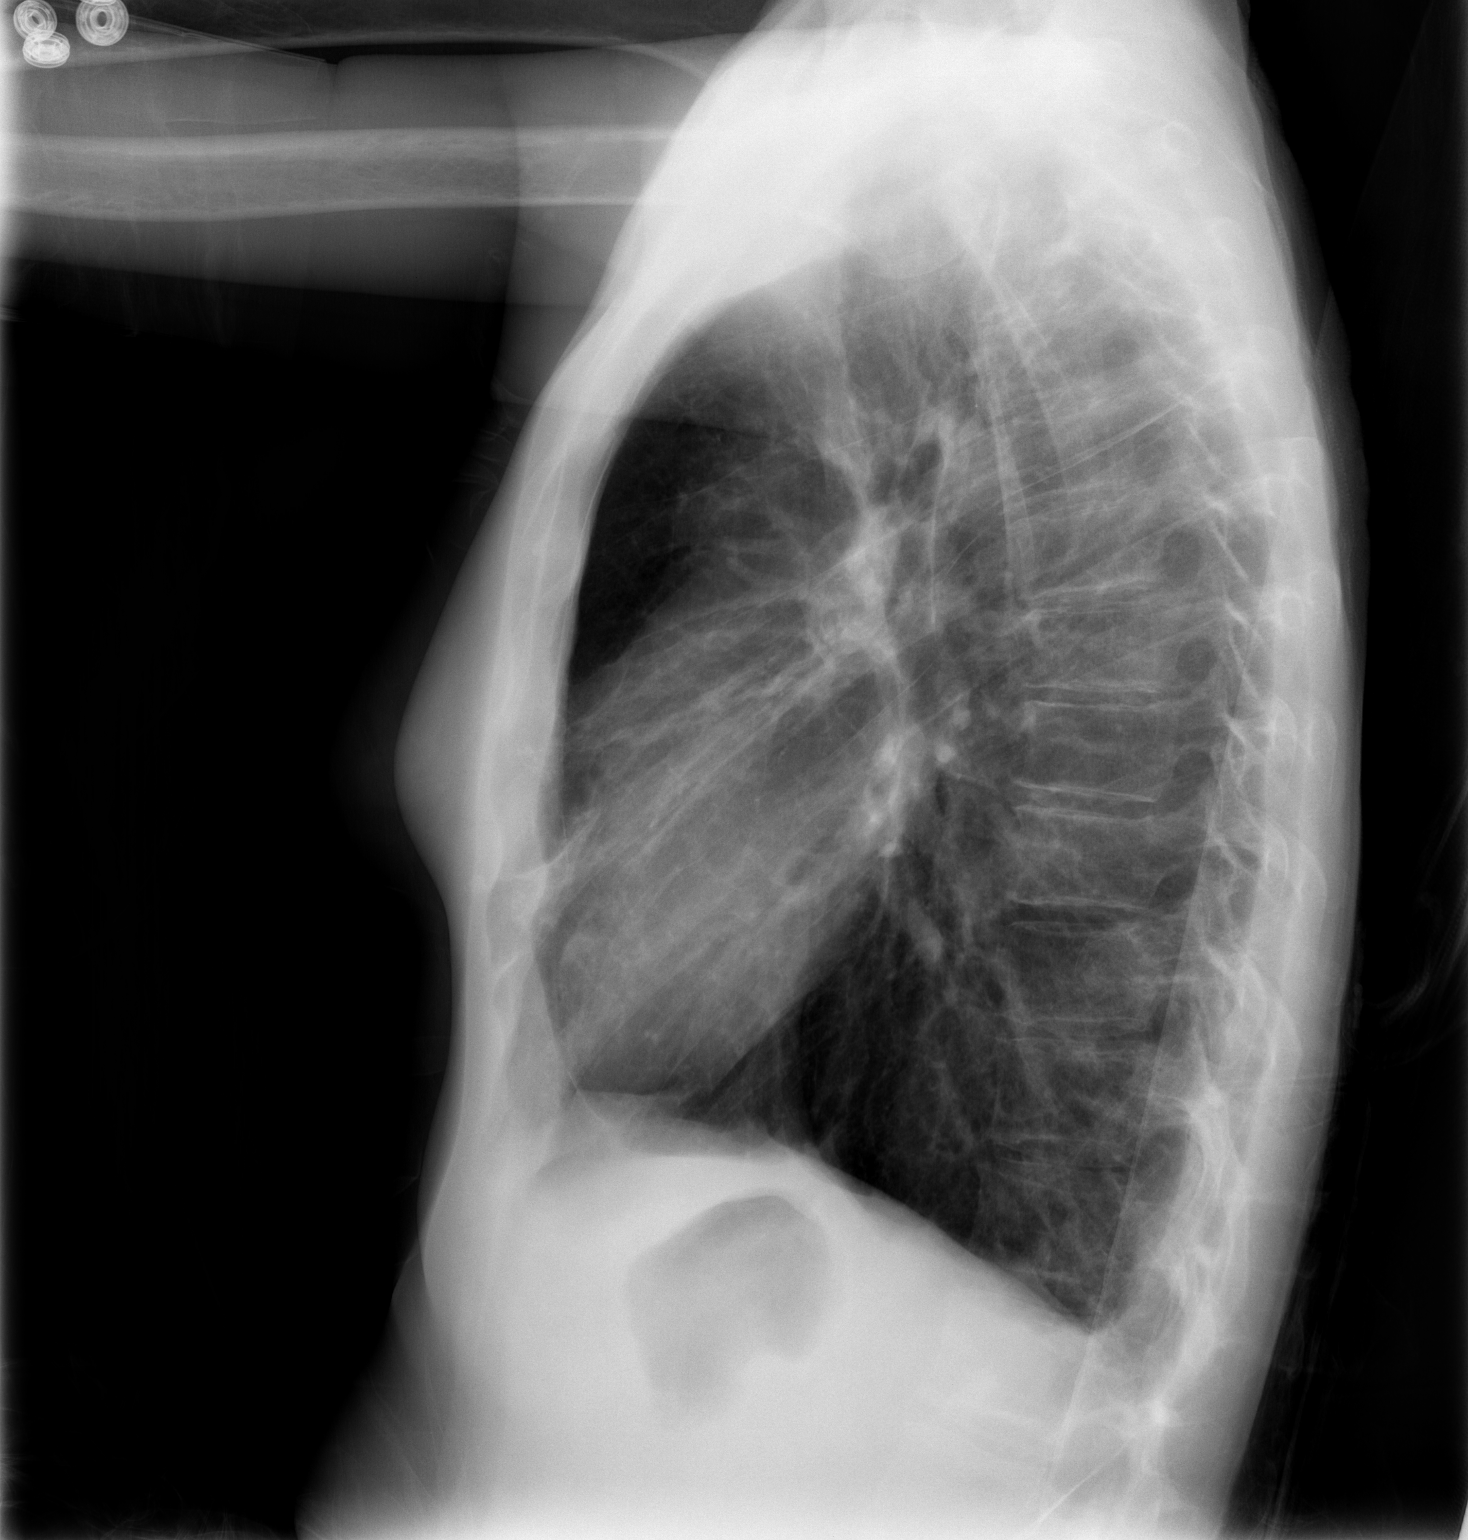

[1 of 1 positions shown; findings below may reference images not displayed]

FINDINGS: Hyperinflation is present compatible with emphysema. Flattening of
the hemidiaphragms. No airspace disease. Bilateral pleural apical
scarring. Extreme apices are excluded from view. Aortic arch
atherosclerosis.
IMPRESSION: Emphysema without acute cardiopulmonary disease.
